# Patient Record
Sex: Female | Born: 1940 | Race: White
Health system: Southern US, Community
[De-identification: ages and names within clinical notes are randomized; demographics above are authoritative.]

## PROBLEM LIST (undated history)

## (undated) ENCOUNTER — Ambulatory Visit (INDEPENDENT_AMBULATORY_CARE_PROVIDER_SITE_OTHER): Admission: RE | Payer: Self-pay

## (undated) VITALS — BP 139/67 | HR 68

## (undated) DIAGNOSIS — F909 Attention-deficit hyperactivity disorder, unspecified type: Secondary | ICD-10-CM

## (undated) DIAGNOSIS — I10 Essential (primary) hypertension: Secondary | ICD-10-CM

## (undated) DIAGNOSIS — M1990 Unspecified osteoarthritis, unspecified site: Secondary | ICD-10-CM

## (undated) DIAGNOSIS — Z923 Personal history of irradiation: Secondary | ICD-10-CM

## (undated) DIAGNOSIS — F419 Anxiety disorder, unspecified: Secondary | ICD-10-CM

## (undated) DIAGNOSIS — M810 Age-related osteoporosis without current pathological fracture: Secondary | ICD-10-CM

## (undated) DIAGNOSIS — K219 Gastro-esophageal reflux disease without esophagitis: Secondary | ICD-10-CM

## (undated) DIAGNOSIS — C50919 Malignant neoplasm of unspecified site of unspecified female breast: Secondary | ICD-10-CM

## (undated) DIAGNOSIS — J302 Other seasonal allergic rhinitis: Secondary | ICD-10-CM

## (undated) DIAGNOSIS — M1711 Unilateral primary osteoarthritis, right knee: Secondary | ICD-10-CM

## (undated) DIAGNOSIS — M545 Low back pain, unspecified: Secondary | ICD-10-CM

## (undated) DIAGNOSIS — R112 Nausea with vomiting, unspecified: Secondary | ICD-10-CM

## (undated) DIAGNOSIS — J45909 Unspecified asthma, uncomplicated: Secondary | ICD-10-CM

## (undated) HISTORY — PX: D&C WITH HYSTEROSCOPY: SHX510231

## (undated) HISTORY — DX: Other seasonal allergic rhinitis: J30.2

## (undated) HISTORY — PX: EYE SURGERY: SHX253

## (undated) HISTORY — DX: Anxiety disorder, unspecified: F41.9

## (undated) HISTORY — DX: Unilateral primary osteoarthritis, right knee: M17.11

## (undated) HISTORY — DX: Nausea with vomiting, unspecified: R11.2

## (undated) HISTORY — PX: ABCESS DRAINAGE: SHX399

## (undated) HISTORY — PX: JOINT REPLACEMENT: SHX530

## (undated) HISTORY — PX: BREAST LUMPECTOMY: SHX2

## (undated) HISTORY — PX: ABDOMINAL HYSTERECTOMY: SHX81

## (undated) HISTORY — PX: OTHER SURGICAL HISTORY: SHX169

## (undated) HISTORY — PX: KNEE ARTHROSCOPY: SHX127

## (undated) HISTORY — PX: BREAST BIOPSY: SHX20

---

## 1990-10-12 HISTORY — PX: HYSTERECTOMY: SHX81

## 1994-08-02 ENCOUNTER — Emergency Department: Admit: 1994-08-02 | Payer: Self-pay | Admitting: Physical Medicine & Rehabilitation

## 1996-01-04 ENCOUNTER — Ambulatory Visit
Admit: 1996-01-04 | Disposition: A | Discharge status: Home / Self Care | Payer: Self-pay | Source: Ambulatory Visit | Admitting: Internal Medicine

## 1996-06-28 ENCOUNTER — Ambulatory Visit: Admission: RE | Admit: 1996-06-28 | Payer: Self-pay | Source: Ambulatory Visit | Admitting: Gastroenterology

## 1996-09-19 ENCOUNTER — Inpatient Hospital Stay
Admit: 1996-09-19 | Disposition: A | Discharge status: Home / Self Care | Payer: Self-pay | Source: Ambulatory Visit | Admitting: Internal Medicine

## 1997-10-23 ENCOUNTER — Ambulatory Visit
Admit: 1997-10-23 | Disposition: A | Discharge status: Home / Self Care | Payer: Self-pay | Source: Ambulatory Visit | Admitting: Internal Medicine

## 1997-11-07 ENCOUNTER — Inpatient Hospital Stay
Admit: 1997-11-07 | Disposition: A | Discharge status: Home / Self Care | Payer: Self-pay | Source: Ambulatory Visit | Admitting: Critical Care Medicine

## 1999-06-12 ENCOUNTER — Ambulatory Visit: Admit: 1999-06-12 | Disposition: A | Discharge status: Home / Self Care | Payer: Self-pay | Source: Ambulatory Visit

## 2000-06-22 ENCOUNTER — Ambulatory Visit: Admit: 2000-06-22 | Disposition: A | Discharge status: Home / Self Care | Payer: Self-pay | Source: Ambulatory Visit

## 2005-12-09 ENCOUNTER — Ambulatory Visit
Admit: 2005-12-09 | Disposition: A | Discharge status: Home / Self Care | Payer: Self-pay | Source: Ambulatory Visit | Admitting: Surgery

## 2005-12-09 LAB — CBC WITH AUTO DIFFERENTIAL CERNER
Basophils Absolute: 0 /mm3 (ref 0.0–0.2)
Basophils: 1 % (ref 0–2)
Eosinophils Absolute: 0.1 /mm3 (ref 0.0–0.7)
Eosinophils: 2 % (ref 0–5)
Granulocytes Absolute: 2.2 /mm3 (ref 1.8–8.1)
Hematocrit: 40.8 % (ref 37.0–47.0)
Hgb: 14.1 G/DL (ref 12.0–16.0)
Lymphocytes Absolute: 1 /mm3 (ref 0.5–4.4)
Lymphocytes: 27 % (ref 15–41)
MCH: 30.3 PG (ref 28.0–32.0)
MCHC: 34.5 G/DL (ref 32.0–36.0)
MCV: 88 FL (ref 80.0–100.0)
MPV: 9.1 FL (ref 7.4–10.4)
Monocytes Absolute: 0.3 /mm3 (ref 0.0–1.2)
Monocytes: 8 % (ref 0–11)
Neutrophils %: 62 % (ref 52–75)
Platelets: 177 /mm3 (ref 140–400)
RBC: 4.64 /mm3 (ref 4.20–5.40)
RDW: 13.7 % (ref 11.5–15.0)
WBC: 3 /mm3 — ABNORMAL LOW (ref 3.5–10.8)

## 2005-12-09 LAB — BASIC METABOLIC PANEL
BUN: 19 mg/dL (ref 8–20)
CO2: 27 mEq/L (ref 21–30)
Calcium: 9 mg/dL (ref 8.6–10.2)
Chloride: 104 mEq/L (ref 98–107)
Creatinine: 0.7 mg/dL (ref 0.6–1.5)
Glucose: 97 mg/dL (ref 70–100)
Potassium: 3.9 mEq/L (ref 3.6–5.0)
Sodium: 143 mEq/L (ref 136–146)

## 2005-12-09 LAB — GFR

## 2005-12-21 ENCOUNTER — Ambulatory Visit: Admission: RE | Admit: 2005-12-21 | Payer: Self-pay | Source: Ambulatory Visit | Admitting: Surgery

## 2006-09-15 ENCOUNTER — Ambulatory Visit: Admission: RE | Admit: 2006-09-15 | Payer: Self-pay | Source: Ambulatory Visit | Admitting: Gastroenterology

## 2010-10-12 DIAGNOSIS — C50919 Malignant neoplasm of unspecified site of unspecified female breast: Secondary | ICD-10-CM

## 2010-10-12 HISTORY — DX: Malignant neoplasm of unspecified site of unspecified female breast: C50.919

## 2010-12-05 ENCOUNTER — Ambulatory Visit
Admit: 2010-12-05 | Disposition: A | Discharge status: Home / Self Care | Payer: Self-pay | Source: Ambulatory Visit | Admitting: Surgery

## 2010-12-16 ENCOUNTER — Ambulatory Visit
Admit: 2010-12-16 | Disposition: A | Discharge status: Home / Self Care | Payer: Self-pay | Source: Ambulatory Visit | Admitting: Radiation Oncology

## 2011-01-09 ENCOUNTER — Ambulatory Visit
Admit: 2011-01-09 | Disposition: A | Discharge status: Home / Self Care | Payer: Self-pay | Source: Ambulatory Visit | Admitting: Surgery

## 2011-01-09 LAB — BASIC METABOLIC PANEL
BUN: 26 mg/dL — ABNORMAL HIGH (ref 8–20)
CO2: 25 mEq/L (ref 21–30)
Calcium: 9.4 mg/dL (ref 8.6–10.2)
Chloride: 106 mEq/L (ref 98–107)
Creatinine: 0.7 mg/dL (ref 0.6–1.5)
Glucose: 57 mg/dL — ABNORMAL LOW (ref 70–100)
Potassium: 4.7 mEq/L (ref 3.6–5.0)
Sodium: 144 mEq/L (ref 136–146)

## 2011-01-09 LAB — CBC AND DIFFERENTIAL
Basophils Absolute Automated: 0.03 10*3/uL (ref 0.00–0.20)
Basophils Automated: 0 % (ref 0–2)
Eosinophils Absolute Automated: 0.08 10*3/uL (ref 0.00–0.70)
Eosinophils Automated: 1 % (ref 0–5)
Hematocrit: 47.9 % — ABNORMAL HIGH (ref 37.0–47.0)
Hgb: 15.7 g/dL (ref 12.0–16.0)
Immature Granulocytes Absolute: 0.01 10*3/uL
Immature Granulocytes: 0 % (ref 0–1)
Lymphocytes Absolute Automated: 1.27 10*3/uL (ref 0.50–4.40)
Lymphocytes Automated: 22 % (ref 15–41)
MCH: 29.6 pg (ref 28.0–32.0)
MCHC: 32.8 g/dL (ref 32.0–36.0)
MCV: 90.4 fL (ref 80.0–100.0)
MPV: 12.5 fL — ABNORMAL HIGH (ref 9.4–12.3)
Monocytes Absolute Automated: 0.51 10*3/uL (ref 0.00–1.20)
Monocytes: 9 % (ref 0–11)
Neutrophils Absolute: 3.91 10*3/uL (ref 1.80–8.10)
Neutrophils: 67 % (ref 52–75)
Nucleated RBC: 0 /100 WBC
Platelets: 177 10*3/uL (ref 140–400)
RBC: 5.3 10*6/uL (ref 4.20–5.40)
RDW: 14 % (ref 12–15)
WBC: 5.81 10*3/uL (ref 3.50–10.80)

## 2011-01-09 LAB — GFR: EGFR: >60

## 2011-01-11 HISTORY — PX: BREAST SURGERY: SHX581

## 2011-01-21 ENCOUNTER — Ambulatory Visit: Admission: RE | Admit: 2011-01-21 | Payer: Self-pay | Source: Ambulatory Visit | Attending: Surgery | Admitting: Surgery

## 2011-01-21 ENCOUNTER — Ambulatory Visit: Payer: Self-pay

## 2011-01-23 LAB — LAB USE ONLY - HISTORICAL SURGICAL PATHOLOGY

## 2011-02-16 ENCOUNTER — Ambulatory Visit: Admit: 2011-02-16 | Discharge: 2011-02-16 | Payer: Self-pay | Source: Ambulatory Visit

## 2011-03-13 ENCOUNTER — Ambulatory Visit: Admit: 2011-03-13 | Discharge: 2011-03-13 | Payer: Self-pay | Source: Ambulatory Visit

## 2011-06-11 ENCOUNTER — Ambulatory Visit: Admit: 2011-06-11 | Discharge: 2011-06-11 | Payer: Self-pay | Source: Ambulatory Visit

## 2011-06-13 ENCOUNTER — Ambulatory Visit: Admit: 2011-06-13 | Discharge: 2011-06-13 | Payer: Self-pay | Source: Ambulatory Visit

## 2011-06-24 LAB — ECG 12-LEAD
Atrial Rate: 73 {beats}/min
P Axis: 14 degrees
P-R Interval: 140 ms
Q-T Interval: 410 ms
QRS Duration: 86 ms
QTC Calculation (Bezet): 451 ms
R Axis: 69 degrees
T Axis: 52 degrees
Ventricular Rate: 73 {beats}/min

## 2011-06-29 ENCOUNTER — Emergency Department
Admit: 2011-06-29 | Disposition: A | Discharge status: Home / Self Care | Payer: Self-pay | Source: Emergency Department | Admitting: Internal Medicine

## 2011-07-25 LAB — ECG 12-LEAD
Atrial Rate: 78 {beats}/min
P Axis: 66 degrees
P-R Interval: 152 ms
Q-T Interval: 386 ms
QRS Duration: 92 ms
QTC Calculation (Bezet): 440 ms
R Axis: 69 degrees
T Axis: 62 degrees
Ventricular Rate: 78 {beats}/min

## 2011-07-30 NOTE — Op Note (Signed)
DATE OF BIRTH:                        11-06-1940      ADMISSION DATE:                     12/21/2005            PATIENT LOCATION:                     [id number]            DATE OF PROCEDURE:                   12/21/2005      SURGEON:                            Arnette Norris, MD      ASSISTANT(S):                  PREOPERATIVE DIAGNOSIS:  PREVIOUS STEREOTACTIC BIOPSY WITH ATYPICAL      INTRADUCTAL HYPERPLASIA.            POSTOPERATIVE DIAGNOSIS:  PREVIOUS STEREOTACTIC BIOPSY WITH ATYPICAL      INTRADUCTAL HYPERPLASIA.            PROCEDURE:  NEEDLE PLACEMENT, LEFT BREAST BIOPSY.            ASSISTANT:  Josie Hymes, PA student            COMPLICATIONS:  None.            ESTIMATED BLOOD LOSS:   Minimal.            ANESTHESIA:  Local sedation.            INDICATION:  The patient is a 70 year old woman, kindly referred by her      internist, Dr. Gwenyth Allegra and her gynecologist, Dr. Baird Cancer      for surgical evaluation.  This patient had had a stereotactic biopsy for      microcalcifications which showed areas of atypical intraductal hyperplasia.      I discussed this with the patient, explaining that the standard is to      perform an excisional biopsy to exclude further advanced disease.            The patient understood this, as well as the potential risk versus benefits      of operation and elected to proceed with surgical excision on this date.            On the day of surgery, the patient was first taken to x-ray, where a hook      needle and methylene blue dye were injected into the left breast to      demarcate the area to be excised.  Once this was in place, the patient was      brought to the operating room.            The patient and her husband were seen in the preoperative holding area.      Final questions were answered.  The boarding pass and informed consent were      complete, and she was taken to the operating room.            DESCRIPTION OF PROCEDURE:  The patient was placed on the  operative table in  the supine position.  After she was sedated, the left breast was prepped      and draped in the usual sterile fashion.  The skin and subcutaneous tissue      in the site to be incised was infiltrated with a combination of local      anesthetic.  When this had taken effect, a curvilinear incision within the      skin folds of the breast was made.  The subcutaneous tissue was divided.      The needle shaft identified.  The shaft was then followed toward the breast      parenchyma.  At the appropriate level, the parenchyma was grasped with an      Allis clamp and excisional biopsy around the needle was done.  It contained      the methylene blue dye and contained the tip of the needle, as well.  The      specimen was then sent for confirmation radiographically.  When this was      complete, the biopsy site was inspected.  Fine bleeding controlled with      cautery and, when satisfied, further local was injected for postoperative      analgesia.            The skin was then closed with combination of interrupted, inverting 4-0      Vicryl subcutaneous stitches and a running, subcuticular Monocryl.  The      wounds were cleansed and dressed sterilely.  The patient tolerated the      procedure well, had no intraoperative complications and was transferred to      the recovery area in stable condition, with all sponge, needle and      instrument counts reported as correct by the nursing staff at the      conclusion of the case.                                                Electronic Signing MD: Arnette Norris, MD  ([id number])            D: 12/21/2005 by Arnette Norris, MD      T: 12/21/2005 by [id number] ([id number]) ([id number])      cc:  Gwenyth Allegra, MD          Janann August, MD          Arnette Norris, MD

## 2011-08-12 NOTE — Op Note (Signed)
[name], [name]      MRN:          [id number]      Account:      [id number]      Document ID:  [id number]      Procedure Date: 01/21/2011            Admit Date: 01/21/2011            Patient Location: DISCHARGED 01/21/2011      Patient Type: A            SURGEON:      ASSISTANT:                  ASSISTANT:      Dr. Leticia Clas.            PREOPERATIVE DIAGNOSIS:      Ductal carcinoma of the left breast.            POSTOPERATIVE DIAGNOSIS:      Ductal carcinoma of the left breast.            TITLE OF PROCEDURE:      1.  Needle placement, lumpectomy left breast upper outer quadrant.      2.  Left axillary sentinel lymph node biopsy with frozen section pathology.      3.  Use of the gamma probe to detect the sentinel node.      4.  Injection of Lymphazurin blue dye for sentinel lymph node detection.            COMPLICATIONS:      None.            ESTIMATED BLOOD LOSS:      Minimal.            ANESTHESIA:      General.            INDICATIONS:      The patient is a 70 year old woman kindly referred by her physicians      including Dr. Baird Cancer from gynecology and Dr. Gwenyth Allegra from      medicine for surgical evaluation of a well-differentiated invasive ductal      carcinoma proven by needle biopsy.  The patient was seen in consultation,      the findings reviewed.  The rationale for surgery in a breast conserving      matter was discussed in detail with she and her husband.  Additionally, the      patient also had consultation with Dr. Felicity Coyer from medical      oncology who concurred with this approach following bilateral breast MRI.            The patient understood the procedure to be undertaken, accepted risks and      benefits and elected operative intervention on this date.                                   Page 1 of 3      [name], [name]      MRN:          [id number]      Account:      [id number]      Document ID:  [id number]      Procedure Date: 01/21/2011                  On the day of  surgery, after the patient was seen in radiology and nuclear      medicine, I met with she and her husband preoperatively.  Final questions      were answered.  The boarding pass and informed consent were complete.  She      was covered prophylactically with vancomycin secondary to her PENICILLIN      allergy.  Pneumatic stockings were placed knee-high and she was taken to      the operating room.            DESCRIPTION OF PROCEDURE:      The patient was placed on the operative table in the supine position and      after she was placed under general LMA anesthesia, the left axilla was      probed with the Neoprobe and the area of highest intensity was marked.      Then, after the pause had been accomplished, Lymphazurin blue dye was      injected, 2 mL, in the periareolar region.  The breast was appropriately      massaged and when complete was then prepped and draped in the usual sterile      fashion.  Using the lines that had been drawn for incision obliquely in the      upper outer quadrant, the skin and subcutaneous tissue was injected with a      combination of local anesthetic.  Once this had taken effect, the incision      was opened and skin flaps elevated circumferentially around the area for      lumpectomy.            However, initially attention was focused then to the upper portion of the      incision where the incision was carried down to the clavipectoral fascia      which was opened and the axilla entered.  Then following the methylene blue      dye, a lymph node of blue intensity was noted and this also was the highest      intensity.  When probed with the Neoprobe.  It was excised and sent to      pathology as the sentinel node.  After this was removed, the axilla was      probed once again and there was one additional area of counts greater than      10% of baseline and there was another slightly less intense blue node which      was removed and was sent as additional lymphatic tissue.  Dr.  Dorothyann Gibbs      reported that the lymph node was negative for metastasis.  Satisfied then,      attention was focused back to the area for lumpectomy.            A full-thickness lumpectomy was performed with grossly wide margins.  It      should be noted that the lateral margin was the lateral extent of the      breast and the superior margin was the tail of the breast and the deep      margin was the pectoral fascia.  Once the specimen was removed,  however,      was placed on the back table would be inked by the attending surgeon prior      to sending it to radiology.            The wound was then checked.  Fine  bleeding controlled with cauterization.      Further local injected for postoperative analgesia prior to skin closure,      which was done in the usual way.            The patient tolerated the procedure well, had no intraoperative                                   Page 2 of 3      [name], [name]      MRN:          [id number]      Account:      [id number]      Document ID:  [id number]      Procedure Date: 01/21/2011            complications and was transferred to the outpatient recovery area in stable      condition with all sponge, needle and instrument count reported as correct      by the nursing staff at the conclusion of the case.                        Electronic Signing Provider            D:  01/21/2011 11:45 AM by Dr. Arnette Norris, MD [phone])      T:  01/21/2011 20:32 PM by [id number]                  HK:[id number] Dareen Piano MD      Baird Cancer MD      Felicity Coyer MD                                   Page 3 of 3      Authenticated by Arnette Norris, MD [phone]) On 02/04/2011 05:52:01 PM

## 2011-09-29 NOTE — Op Note (Signed)
MRN: [id number] DOCUMENT ID: [id number]      INTRODUCTION:      70 YEAR OLD FEMALE PATIENT PRESENTS FOR AN ELECTIVE OUTPATIENT      COLONOSCOPY.  THE INDICATIONS FOR THE PROCEDURE WERE A POSITIVE FAMILY      HISTORY OF COLON CANCER AND DIARRHEA.      CONSENT:      THE BENEFITS, RISKS, AND ALTERNATIVES TO THE PROCEDURE WERE DISCUSSED AND      INFORMED CONSENT WAS OBTAINED.      PREPARATION:      EKG, PULSE, PULSE OXIMETRY AND BLOOD PRESSURE MONITORED.      MEDICATIONS:      - MIDAZOLAM HCL 5 MG IV      - FENTANYL 200 MCG IV      - ZOFRAN 4 MG IV BEFORE THE PROCEDURE      THE ENDOSCOPE WAS PASSED WITH A MODERATE AMOUNT OF DIFFICULTY THROUGH A      VERY TORTUOUS AND FLOPPY COLON UNDER DIRECT VISUALIZATION TO THE TERMINAL      ILEUM CONFIRMED BY LANDMARKS.  THE STUDY WAS PERFORMED WITH A PCF-160AL.      THE QUALITY OF THE PREPARATION WAS GOOD.  SOME WASHING WAS REQUIRED TO      REMOVE THICK GREEN FLUID AND SEE ALL MUCOSAL SURFACES WELL.  RETROFLEXION      WAS PERFORMED IN THE RECTUM.  SCOPE WITHDRAWEL TIME FROM THE CECUM WAS:11      MINUTES.      FINDINGS:  NO POLYPS WERE SEEN.  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  NORMAL VASCULAR PATTERN THROUGHOUT, BUT RANDOM BIOPSIES      WERE TAKEN IN THE CECUM TO RULE OUT MICROSCOPIC INFLAMMATION.  SMALL      INTERNAL HEMORRHOIDS WERE PRESENT.  THE COLONOSCOPY WAS OTHERWISE NORMAL.      COMPLICATIONS:      THERE WERE NO COMPLICATIONS ASSOCIATED WITH THE PROCEDURE.      IMPRESSION:      1.  NO POLYPS WERE SEEN.      2.  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      3.  NORMAL VASCULAR PATTERN THROUGHOUT, BUT RANDOM BIOPSIES WERE TAKEN IN      THE CECUM TO RULE OUT MICROSCOPIC INFLAMMATION.      4.  SMALL INTERNAL HEMORRHOIDS WERE PRESENT [455.0].      5.  COLONOSCOPY, OTHERWISE NORMAL.      RECOMMENDATION:      - REPEAT COLONOSCOPY IN 5 YEARS.      - FOLLOW-UP ON THE RESULTS OF BIOPSY SPECIMENS IN 10 DAYS.      SIGNING PHYSICIAN: Zakiah Beckerman S

## 2011-11-13 ENCOUNTER — Emergency Department: Payer: Medicare Other

## 2011-11-13 ENCOUNTER — Emergency Department
Admission: EM | Admit: 2011-11-13 | Discharge: 2011-11-13 | Disposition: A | Discharge status: Home / Self Care | Payer: Medicare Other | Attending: Emergency Medicine | Admitting: Emergency Medicine

## 2011-11-13 DIAGNOSIS — I10 Essential (primary) hypertension: Secondary | ICD-10-CM | POA: Insufficient documentation

## 2011-11-13 DIAGNOSIS — S300XXA Contusion of lower back and pelvis, initial encounter: Secondary | ICD-10-CM | POA: Insufficient documentation

## 2011-11-13 DIAGNOSIS — W010XXA Fall on same level from slipping, tripping and stumbling without subsequent striking against object, initial encounter: Secondary | ICD-10-CM | POA: Insufficient documentation

## 2011-11-13 DIAGNOSIS — T148XXA Other injury of unspecified body region, initial encounter: Secondary | ICD-10-CM

## 2011-11-13 NOTE — ED Provider Notes (Signed)
History     Chief Complaint   Patient presents with   . Tailbone Pain     HPI Comments: 71 y.o. female s/p slip on ice just PTA.  "my legs went forward and I landed on my bottom".  Immediate pain to tail bone.  Stood up with assistance after fall.  No back pain. Did not hit head or have LOC.  No preceding cp/sob/palpitations.      Patient is a 71 y.o. female presenting with fall.   Fall  The accident occurred 1 to 2 hours ago. The fall occurred while walking. She landed on concrete. The pain is at a severity of 2/10. The pain is mild. She was ambulatory at the scene. There was no drug use involved in the accident. Pertinent negatives include no visual change, no numbness, no nausea, no headaches and no loss of consciousness.       Past Medical History   Diagnosis Date   . Cancer    . Hypertensive disorder        Past Surgical History   Procedure Date   . Breast surgery        History reviewed. No pertinent family history.    No current facility-administered medications for this encounter.     No current outpatient prescriptions on file.       Allergies   Allergen Reactions   . Erythromycin    . Morphine    . Penicillins    . Sulfur    . Tetracycline        History   Substance Use Topics   . Smoking status: Never Smoker    . Smokeless tobacco: Not on file   . Alcohol Use: No       Review of Systems   Gastrointestinal: Negative for nausea.   Neurological: Negative for loss of consciousness, numbness and headaches.   [all other systems reviewed and are negative        Physical Exam   BP 186/81  Pulse 75  Temp(Src) 97.8 F (36.6 C) (Oral)  Resp 18    Physical Exam  Constitutional: Vital signs reviewed.  Oriented to person, place and time. Comfortable.  Head: Normocephalic  Eyes: No conjunctival injection. No discharge.  ENT: Posterior pharynx normal. No exudates.  No trismus. No drooling.  No tongue elevation.    Neck: Normal range of motion. Non-tender.  Respiratory/Chest: Clear to auscultation. No respiratory  distress. No tenderness. No chest wall or neck crepitus.   Cardiovascular: Normal rate. Regular rhythm.  Capillary refill < 1 second in all extremities.  No JVD.  Abdomen: Soft and non-tender. No guarding. No rebound. No distension.    Genitourinary:   UpperExtremity: No edema.     Back:  No CVA tenderness.  No spine tenderness.  TTP over coccyx.   Nl ROM over bilateral hips. No hip tenderness.  No ttp over pelvis.  LowerExtremity: No edema.  No calf tenderness.  Neurological: Normal and symmetric strength and sensation in bilateral arms and legs  Skin: Warm and dry. No rash.  Lymphatic: No cervical lymphadenopathy.  Psychiatric: Normal affect. Normal concentration.    ED Course   Procedures    MDM  Number of Diagnoses or Management Options  Diagnosis management comments: 71 y.o. female s/p fall as described above.  Now w/ tenderness over coccyx.  Xrays of her coccyx and pelvis are negative for any fracture.  Pt ambulating now without any difficulty and seems quite comfortable.  Will d/c.  Amount and/or Complexity of Data Reviewed  Tests in the radiology section of CPT: ordered and reviewed    Risk of Complications, Morbidity, and/or Mortality  Presenting problems: moderate  Diagnostic procedures: moderate  Management options: moderate    Patient Progress  Patient progress: stable      Initial MD eval by Dr Delorse Lek at 8:23 PM           Delorse Lek, MD  11/13/11 2051

## 2011-11-13 NOTE — ED Notes (Signed)
Patient states she fell [address]. [address] backwards injuring lower back. Denies LOC

## 2011-11-13 NOTE — Discharge Instructions (Signed)
Contusion     You have been diagnosed with a contusion.     A contusion is a bruise. A contusion occurs when something strikes or hits the body. This breaks small blood vessels called capillaries. When the capillaries break, blood leaks out. This makes the skin look red, purple, blue, or black. The injured area may hurt for a few days. If you take a blood thinner (like Coumadin or warfarin) the bruising may be worse.     Apply ice to the bruise. Avoid using the injured body part.     Apply ice to help with pain and swelling. Put some ice cubes in a re-sealable plastic bag (like Ziploc). Add some water. Seal the bag. Put a thin washcloth between the bag and the skin. Apply the ice bag for at least 20 minutes. Do this at least 4 times per day. It’s okay to apply ice longer or more often. NEVER APPLY ICE DIRECTLY TO THE SKIN. Always keep a washcloth between the ice pack and your body.     YOU SHOULD SEEK MEDICAL ATTENTION IMMEDIATELY, EITHER HERE OR AT THE NEAREST EMERGENCY DEPARTMENT, IF ANY OF THE FOLLOWING OCCURS:  · Your pain or swelling gets much worse.  · You develop new numbness or tingling in or below the affected area.  · Your foot or hand looks cold or pale. This could mean there is a problem with circulation (blood supply).

## 2011-11-30 ENCOUNTER — Other Ambulatory Visit: Payer: Self-pay | Admitting: Orthopaedic Surgery

## 2011-11-30 DIAGNOSIS — M25561 Pain in right knee: Secondary | ICD-10-CM

## 2011-11-30 DIAGNOSIS — M25461 Effusion, right knee: Secondary | ICD-10-CM

## 2011-12-01 ENCOUNTER — Ambulatory Visit: Payer: Medicare Other | Attending: Orthopaedic Surgery

## 2011-12-01 DIAGNOSIS — M2240 Chondromalacia patellae, unspecified knee: Secondary | ICD-10-CM | POA: Insufficient documentation

## 2011-12-01 DIAGNOSIS — Z853 Personal history of malignant neoplasm of breast: Secondary | ICD-10-CM | POA: Insufficient documentation

## 2011-12-01 DIAGNOSIS — M25461 Effusion, right knee: Secondary | ICD-10-CM

## 2011-12-01 DIAGNOSIS — M25561 Pain in right knee: Secondary | ICD-10-CM

## 2011-12-01 DIAGNOSIS — S83106A Unspecified dislocation of unspecified knee, initial encounter: Secondary | ICD-10-CM | POA: Insufficient documentation

## 2011-12-01 DIAGNOSIS — M25569 Pain in unspecified knee: Secondary | ICD-10-CM | POA: Insufficient documentation

## 2011-12-07 ENCOUNTER — Ambulatory Visit
Admission: RE | Admit: 2011-12-07 | Disposition: A | Discharge status: Home / Self Care | Payer: Self-pay | Source: Ambulatory Visit | Attending: Gastroenterology | Admitting: Gastroenterology

## 2012-06-12 ENCOUNTER — Ambulatory Visit: Payer: Medicare Other | Attending: Hematology & Oncology

## 2012-06-12 DIAGNOSIS — C50919 Malignant neoplasm of unspecified site of unspecified female breast: Secondary | ICD-10-CM | POA: Insufficient documentation

## 2012-06-16 ENCOUNTER — Encounter: Payer: Self-pay | Admitting: Radiation Oncology

## 2012-06-16 ENCOUNTER — Ambulatory Visit: Payer: Self-pay | Admitting: Radiation Oncology

## 2012-06-16 DIAGNOSIS — C50919 Malignant neoplasm of unspecified site of unspecified female breast: Secondary | ICD-10-CM

## 2012-06-20 DIAGNOSIS — C50919 Malignant neoplasm of unspecified site of unspecified female breast: Secondary | ICD-10-CM

## 2012-06-20 HISTORY — DX: Malignant neoplasm of unspecified site of unspecified female breast: C50.919

## 2012-06-21 NOTE — Progress Notes (Signed)
[name] came in today with her husband for a followup visit.  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  She is doing well overall.  She had a  mammogram in April of this year which was negative.  She continues on  anastrozole and she is tolerating that well.  She continues to follow with  Dr. Derek Mound and Dr. Molly Maduro.     PHYSICAL EXAMINATION:  GENERAL:  She appears well.    LYMPHATICS:  She has no palpable supraclavicular or axillary  lymphadenopathy bilaterally.    BREASTS:  The breasts are without masses bilaterally.  She has no  hyperpigmentation.     IMPRESSION:  Doing well with no evidence of recurrence.     RECOMMENDATION:  I discussed with the patient that she should continue to have yearly  mammograms.  She will continue to follow with Dr. Molly Maduro and Dr. Derek Mound.   She had some questions about the anastrozole.  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  I told her that she could  discuss this further with Dr. Molly Maduro.  We did discuss the role of hormonal  therapy in reducing her risk of local recurrence and reducing her risk of a  new contralateral breast cancer in the future.  All of her questions were  answered.  She will see me on an as-needed basis.

## 2013-06-24 ENCOUNTER — Ambulatory Visit (INDEPENDENT_AMBULATORY_CARE_PROVIDER_SITE_OTHER): Payer: Medicare Other | Admitting: Adult Health

## 2013-06-24 ENCOUNTER — Encounter (INDEPENDENT_AMBULATORY_CARE_PROVIDER_SITE_OTHER): Payer: Self-pay

## 2013-06-24 VITALS — BP 146/71 | HR 71 | Temp 98.0°F | Resp 14 | Ht 65.0 in | Wt 130.0 lb

## 2013-06-24 DIAGNOSIS — S0512XA Contusion of eyeball and orbital tissues, left eye, initial encounter: Secondary | ICD-10-CM

## 2013-06-24 DIAGNOSIS — S0510XA Contusion of eyeball and orbital tissues, unspecified eye, initial encounter: Secondary | ICD-10-CM

## 2013-06-24 DIAGNOSIS — S0993XA Unspecified injury of face, initial encounter: Secondary | ICD-10-CM

## 2013-06-24 MED ORDER — MOXIFLOXACIN HCL 0.5 % OP SOLN
1.0000 [drp] | Freq: Three times a day (TID) | OPHTHALMIC | Status: AC
Start: 2013-06-24 — End: 2013-07-01

## 2013-06-24 MED ORDER — CEPHALEXIN 500 MG PO CAPS
500.0000 mg | ORAL_CAPSULE | Freq: Four times a day (QID) | ORAL | Status: AC
Start: 2013-06-24 — End: 2013-07-04

## 2013-06-24 NOTE — Progress Notes (Signed)
Subjective:       Patient ID: [name] is a 72 y.o. female.  Chief Complaint   Patient presents with   . Fall     c/o fall on concrete curb x 12 hours. Pt use alert and orientated with bruising throughout left side of face. Pt denies dizziness and vision change. Pt has not self treated with OTC.         Fall  The accident occurred 12 to 24 hours ago. The fall occurred while walking. She fell from a height of 3 to 5 ft. She landed on concrete. The volume of blood lost was minimal. The point of impact was the face, right knee and left hip. The pain is present in the face and head. The pain is at a severity of 4/10. The pain is moderate. Pertinent negatives include no abdominal pain, bowel incontinence, loss of consciousness, numbness, tingling, visual change or vomiting. She has tried ice, immobilization and acetaminophen for the symptoms. The treatment provided no relief.       The following portions of the patient's history were reviewed and updated as appropriate: allergies, current medications, past family history, past medical history, past social history, past surgical history and problem list.    Review of Systems   Constitutional: Positive for activity change.   HENT: Positive for facial swelling.    Eyes: Positive for redness.   Gastrointestinal: Negative for vomiting, abdominal pain and bowel incontinence.   Neurological: Negative for tingling, loss of consciousness and numbness.   All other systems reviewed and are negative.            Objective:     Physical Exam   Constitutional: Vital signs are normal. She appears well-developed and well-nourished. She is active.   HENT:   Head: Normocephalic.       Right Ear: Hearing, tympanic membrane, external ear and ear canal normal.   Left Ear: Hearing, tympanic membrane, external ear and ear canal normal.   Nose: Mucosal edema and rhinorrhea present.   Mouth/Throat: Uvula is midline. Posterior oropharyngeal edema and posterior oropharyngeal erythema  present.       Eyes: Pupils are equal, round, and reactive to light. Left eye exhibits exudate. Left eye exhibits no discharge and no hordeolum. No foreign body present in the left eye.       Neurological: She is alert.   Skin: Skin is warm and dry.           Assessment:       1. Facial injury, initial encounter  cephALEXin (KEFLEX) 500 MG capsule    moxifloxacin (VIGAMOX) 0.5 % ophthalmic solution   2. Eye contusion, left, initial encounter  moxifloxacin (VIGAMOX) 0.5 % ophthalmic solution           Plan:        Medicines as prescribed .    Follow-up w/ PMD

## 2013-06-24 NOTE — Patient Instructions (Signed)
Laceration, Lip and Mouth  Alaceration is a cut through the skin. When the cut is on the outside of the lip, it may be closed with stitches, surgical tape, or sometimes skin glue. Cuts inside the mouth may be sutured or left open, depending on the size. When stitches are used in the mouth, they are usually the kind that dissolve.    Home care  The following guidelines will help you care for your laceration at home:   Eat soft foods to reduce pain when chewing.   If the cut isinsideyour mouth, clean the wound by rinsing your mouth after each meal and at bedtime with a mixture of equal parts water and hydrogen peroxide (do not swallow!). Or, you can use a cotton swab to apply hydrogen peroxide directly onto the cut.   Mouth wounds can be painful when eating. You may use a local, over-the-counter numbing solution for pain relief. If this is not available, you may use any numbing solution for teething babies. You may apply this directly to the sores with a cotton-tip swab or with your finger.   If the cut is on theoutsideof the lip and sutures were used, you may shower as usual after the first 24 hours, but do not put your head under water until the sutures are removed. After removing the bandage, wash the area with soap and water. Use a wet cotton swab to loosen and remove any blood or crust that forms. After cleaning, keep the wound clean and dry. Talk with your doctor before applying any antibiotic ointment to the wound. You may apply an adhesive bandage or leave the wound open.   If surgical tape was used, keep the area clean and dry. If it becomes wet, blot it dry with a towel. Talk with your doctor before applying any antibiotic ointment to the wound. The surgical tape closures will usually fall off after about 5 days.   If skin glue was used, do not scratch, rub, or pick at the adhesive film. Do not place tape directly over the film.Do not apply liquid, ointment, or creams to the wound while the  film is inplace.Do not clean the wound with peroxide and do not apply ointment. Avoid activities that cause heavy sweating until the film has fallen off. Protect the wound from prolonged exposure to sunlight or tanning lamps. You may shower as usual but do not soak the wound in water (no swimming).   If you were given an antibiotic to prevent infection, do not stop taking this medication until you have finished the prescribed course or the doctor tells you to stop.   The doctor may prescribe medications for pain. Follow the doctor's instructions for taking these medications.If you have chronic liver or kidney disease or ever had a stomach ulcer or GI bleeding, talk with your doctor before using these medicines.  Follow-up care  Follow up with your health care provider. Cuts in and around the mouth heal in about five days. However, even with proper treatment, a wound infection sometimes occurs. Therefore, check the wound daily for the warning signs listed below. Stitches should not be left in the face for more thanfivedays; otherwise, permanent stitch marks may form. Unless told otherwise, you may remove surgical tape closures yourself afterfive days, if they have not already fallen off. Ifskin glue was used, the film will fall off by itself in 5-10 days.  When to seek medical care  Get prompt medical attention if any of these occur:     Increasing pain in the wound   Fever of 100.26F (38C) or higher, or as directed by your health care provider   Redness, swelling, or pus coming from the wound   If sutures come apart or fall out or if surgical tape falls off before three days   If the wound edges reopen   Bleeding not controlled by direct pressure   [address], [address] [place], [place] [address]. All rights reserved. This information is not intended as a substitute for professional medical care. Always follow your healthcare professional's instructions.      Facial Contusion (No  Wake-Up)  A facial contusion is a bruise with swelling and sometimes bleeding under the skin. The swelling should start to go down within two days. Although there may be no signs of a serious injury at this time, symptoms may appear later which could be a sign of a more serious problem. Therefore, watch for the warning signs below.  Home care  The following guidelines will help you care for your injury at home:   If you have swelling of the face, apply an ice pack (ice cubes in a plastic bag, wrapped in a towel) for 20 minutes every 1-2 hours until the swelling starts to go down.   If you have scrapes or cuts on your face, clean them daily with soap and water. Apply an antibiotic ointment or cream for the first few days to prevent infection.   You may use acetaminophen or ibuprofen to control pain, unless another pain medicine was prescribed.If you have chronic liver or kidney disease or ever had a stomach ulcer or GI bleeding, talk with your doctor before using these medicines. Do not use ibuprofen in children under six months of age.   For the next 24 hours:   Do not take alcohol, sedatives or medicines that make you sleepy.   Do not drive or operate machinery.   Avoid strenuous activities. No lifting or straining.   If you have had any symptoms of aconcussiontoday (nausea, vomiting, dizziness, confusion, headache, memory loss or if you were knocked out), do not return to sports or any activity that could result in another head injury until all symptoms are gone and you have been cleared by your doctor. A second head injury before fully recovering from the first one can lead to serious brain injury.  Follow-up care  Follow up with your doctor in one week or as directed.  Note: Any X-rays or CT scans taken will be reviewed by a radiologist. You will be notified of any new findings that may affect your care.  When to seek medical care  Get prompt medical attention if any of the following  occur:   Repeated vomiting   Severe or worsening headache or dizziness   Unusual drowsiness, or unable to awaken as usual   Confusion or change in behavior or speech, memory loss, blurred vision   Convulsion (seizure)   Increasing scalp or face swelling   Redness, warmth or pus from the swollen area   Fluid drainage or bleeding from the nose or ears   Fever of 100.26F (38C) or higher, or as directed by your health care provider   Increasing jaw pain with chewing or increasing pain in the sinuses   Nose looks crooked or cannot breathe through your nose after swelling goes down   [address], [address] [place], [place] [address]. All rights reserved. This information is not intended as a  substitute for professional medical care. Always follow your healthcare professional's instructions.

## 2013-11-15 ENCOUNTER — Encounter (INDEPENDENT_AMBULATORY_CARE_PROVIDER_SITE_OTHER): Payer: Self-pay | Admitting: Surgery

## 2014-01-24 ENCOUNTER — Encounter (INDEPENDENT_AMBULATORY_CARE_PROVIDER_SITE_OTHER): Payer: Self-pay

## 2014-02-16 ENCOUNTER — Ambulatory Visit (INDEPENDENT_AMBULATORY_CARE_PROVIDER_SITE_OTHER): Payer: Medicare Other | Admitting: Surgery

## 2014-05-01 ENCOUNTER — Encounter (INDEPENDENT_AMBULATORY_CARE_PROVIDER_SITE_OTHER): Payer: Self-pay | Admitting: Surgery

## 2014-05-01 ENCOUNTER — Ambulatory Visit (INDEPENDENT_AMBULATORY_CARE_PROVIDER_SITE_OTHER): Payer: Medicare Other | Admitting: Surgery

## 2014-05-01 VITALS — BP 132/70 | HR 59 | Temp 98.1°F | Ht 65.0 in | Wt 128.8 lb

## 2014-05-01 DIAGNOSIS — Z853 Personal history of malignant neoplasm of breast: Secondary | ICD-10-CM

## 2014-05-01 HISTORY — DX: Personal history of malignant neoplasm of breast: Z85.3

## 2014-05-01 NOTE — Progress Notes (Signed)
Subjective:   [name] is a 73 y.o. female who is here for her annual surgical breast follow up exam and review of her mammogram.  She had breast conserving treatment for a Grade 1, ER+,  T1N0MX left breast cancer in 2012 and remains asymptomatic.     Past Medical History   Diagnosis Date   . Cancer    . Hypertensive disorder    . Seasonal allergies    . Breast cancer      Past Surgical History   Procedure Laterality Date   . Breast surgery Left 01/2011     well diff CA 0.8 cm Grade 1 (T1) N0MX  ER +     Family History   Problem Relation Age of Onset   . Colon cancer Mother 37     Deceased   . Cancer Father    . Cancer Other 55     prostate     History     Social History   . Marital Status: Married     Spouse Name: N/A     Number of Children: N/A   . Years of Education: N/A     Occupational History   . Not on file.     Social History Main Topics   . Smoking status: Never Smoker    . Smokeless tobacco: Not on file   . Alcohol Use: No   . Drug Use: No   . Sexual Activity: Not on file     Other Topics Concern   . Not on file     Social History Narrative     Erythromycin; Morphine; Penicillins; Tetracycline; Levofloxacin; Naprosyn; and Sulfa antibiotics  Current Outpatient Prescriptions   Medication Sig Dispense Refill   . anastrozole (ARIMIDEX) 1 MG tablet Take 1 mg by mouth daily.     Marland Kitchen aspirin 81 MG tablet Take 81 mg by mouth daily.       . bisoprolol (ZEBETA) 10 MG tablet Take 5 mg by mouth daily.      . Calcium Carbonate-Vitamin D (CALTRATE 600+D PO) Take by mouth.     Marland Kitchen GLUCOSAMINE-CHONDROITIN PO Take by mouth.     Marland Kitchen ibuprofen (ADVIL,MOTRIN) 200 MG tablet Take 200 mg by mouth every 6 (six) hours as needed.       . loratadine (CLARITIN) 10 MG tablet Take 10 mg by mouth daily.       . ranitidine (ZANTAC) 150 MG tablet Take 150 mg by mouth 2 (two) times daily.     . risedronate (ACTONEL) 35 MG tablet Take 35 mg by mouth every 7 days. with water on empty stomach, nothing by mouth or lie down for next 30  minutes.       No current facility-administered medications for this visit.       Review of Systems   All other systems reviewed and are negative.    Objective:     Filed Vitals:    05/01/14 1236   BP: 132/70   Pulse: 59   Temp: 98.1 F (36.7 C)     Bilateral diagnostic mammogram w/CAD and tomosynthesis:  BIRADS 2    Physical Exam   Constitutional: She is oriented to person, place, and time. She appears well-developed and well-nourished.   HENT:   Head: Normocephalic and atraumatic.   Eyes: Pupils are equal, round, and reactive to light.   Neck: Normal range of motion. Neck supple.   Cardiovascular: Normal rate and regular rhythm.    Pulmonary/Chest: Effort  normal.   Breast exam:    Right;  FSD w/o evidence of new masses, skin change, nipple retraction or discharge, or LN  Left :  Well healed surgical scar in the UOQ.  No new masses, skin change, nipple retraction or discharge, or LN     Musculoskeletal: Normal range of motion.   Lymphadenopathy:     She has no cervical adenopathy.   Neurological: She is alert and oriented to person, place, and time.   Skin: Skin is warm and dry.   Psychiatric: She has a normal mood and affect. Her behavior is normal. Judgment and thought content normal.   Pt is justifiably teary in discussing her husband's recent demise secondary to prostate cancer.      Assessment:     1. Hx of breast cancer  Mammography diagnostic bilateral     -  Senescent FSD bilateral   -  S/P stage 1 left breast cancer with breast conserving treatment 2012 - remains NED    Plan:     1.  Surgical follow up including bilateral mammogram in 1 yr  2.  Regular follow ups with Drs. Gustavus Bryant and Molly Maduro.     Orders Placed This Encounter   Procedures   . Mammography diagnostic bilateral     Please call [facility] to schedule     Standing Status: Future      Number of Occurrences:       Standing Expiration Date: 07/03/2015     Scheduling Instructions:      Please remind patient to bring MD  order, referral or prescription with them on the day of the exam. If the order was faxed from the doctor's office to central scheduling please make sure the order is scanned into Epic.     Order Specific Question:  Reason for Exam:     Answer:  follow up for left breast cancer treated in 2012

## 2014-05-31 ENCOUNTER — Ambulatory Visit: Payer: Medicare Other | Attending: Internal Medicine

## 2014-05-31 ENCOUNTER — Other Ambulatory Visit: Payer: Self-pay | Admitting: Internal Medicine

## 2014-05-31 DIAGNOSIS — M79671 Pain in right foot: Secondary | ICD-10-CM

## 2014-05-31 DIAGNOSIS — R609 Edema, unspecified: Secondary | ICD-10-CM | POA: Insufficient documentation

## 2014-05-31 DIAGNOSIS — M722 Plantar fascial fibromatosis: Secondary | ICD-10-CM | POA: Insufficient documentation

## 2014-05-31 DIAGNOSIS — M79609 Pain in unspecified limb: Secondary | ICD-10-CM | POA: Insufficient documentation

## 2014-10-19 ENCOUNTER — Emergency Department: Payer: Medicare Other

## 2014-10-19 ENCOUNTER — Emergency Department
Admission: EM | Admit: 2014-10-19 | Discharge: 2014-10-19 | Disposition: A | Discharge status: Home / Self Care | Payer: Medicare Other | Attending: Emergency Medicine | Admitting: Emergency Medicine

## 2014-10-19 DIAGNOSIS — S0992XA Unspecified injury of nose, initial encounter: Secondary | ICD-10-CM | POA: Insufficient documentation

## 2014-10-19 DIAGNOSIS — W19XXXA Unspecified fall, initial encounter: Secondary | ICD-10-CM

## 2014-10-19 DIAGNOSIS — Z7982 Long term (current) use of aspirin: Secondary | ICD-10-CM | POA: Insufficient documentation

## 2014-10-19 DIAGNOSIS — W010XXA Fall on same level from slipping, tripping and stumbling without subsequent striking against object, initial encounter: Secondary | ICD-10-CM | POA: Insufficient documentation

## 2014-10-19 DIAGNOSIS — J302 Other seasonal allergic rhinitis: Secondary | ICD-10-CM | POA: Insufficient documentation

## 2014-10-19 DIAGNOSIS — I10 Essential (primary) hypertension: Secondary | ICD-10-CM | POA: Insufficient documentation

## 2014-10-19 DIAGNOSIS — Y92511 Restaurant or cafe as the place of occurrence of the external cause: Secondary | ICD-10-CM | POA: Insufficient documentation

## 2014-10-19 MED ORDER — OXYMETAZOLINE HCL 0.05 % NA SOLN
2.0000 | Freq: Once | NASAL | Status: DC
Start: 2014-10-19 — End: 2014-10-20
  Administered 2014-10-19: 2 via NASAL
  Filled 2014-10-19: qty 15

## 2014-10-19 NOTE — ED Notes (Signed)
Patient is resting comfortably. Walking around ED without diff.

## 2014-10-19 NOTE — ED Provider Notes (Signed)
Physician/Midlevel provider first contact with patient: 10/19/14 2203         History     Chief Complaint   Patient presents with   . mouth/nose pain s/p fall     HPI Comments: mechianical fall.  Reports fell onto face and maybe broke a bit with hands and knees.  Pain only at nose.  No LOC, no neck pain.  No AMS, no loose teeth, no trouble breathing.    Patient is a 74 y.o. female presenting with fall. The history is provided by the patient.   Fall  The accident occurred 1 to 2 hours ago. Fall occurred: tripped on transition point on floor in restuarant. She landed on a hard floor. Blood loss: nose only. Point of impact: face. Pain location: nose. The pain is at a severity of 2/10. The pain is mild. She was ambulatory at the scene. There was no entrapment after the fall. There was no drug use involved in the accident. Pertinent negatives include no visual change, no fever, no numbness, no abdominal pain, no bowel incontinence, no nausea, no vomiting, no hematuria, no headaches, no hearing loss, no loss of consciousness and no tingling. The symptoms are aggravated by activity. She has tried ice for the symptoms.        Nursing (triage) note reviewed for the following pertinent information:         Past Medical History   Diagnosis Date   . Cancer    . Hypertensive disorder    . Seasonal allergies    . Breast cancer        Past Surgical History   Procedure Laterality Date   . Breast surgery Left 01/2011     well diff CA 0.8 cm Grade 1 (T1) N0MX  ER +   . Hysterectomy         Family History   Problem Relation Age of Onset   . Colon cancer Mother 64     Deceased   . Cancer Father    . Cancer Other 23     prostate       Social  History   Substance Use Topics   . Smoking status: Never Smoker    . Smokeless tobacco: Not on file   . Alcohol Use: No       .     Allergies   Allergen Reactions   . Erythromycin    . Morphine    . Penicillins    . Tetracycline    . Levofloxacin    . Naprosyn [Naproxen]    . Sulfa Antibiotics         Current/Home Medications    ALBUTEROL (PROVENTIL HFA;VENTOLIN HFA) 108 (90 BASE) MCG/ACT INHALER    Inhale 2 puffs into the lungs.    ANASTROZOLE (ARIMIDEX) 1 MG TABLET    Take 1 mg by mouth daily.    ASPIRIN 81 MG TABLET    Take 81 mg by mouth daily.      BISOPROLOL (ZEBETA) 10 MG TABLET    Take 5 mg by mouth daily.     CALCIUM CARBONATE-VITAMIN D (CALTRATE 600+D PO)    Take by mouth.    CHOLECALCIFEROL (VITAMIN D) 1000 UNIT TABLET    Take 1,000 Units by mouth daily.    GLUCOSAMINE-CHONDROITIN PO    Take by mouth.    IBUPROFEN (ADVIL,MOTRIN) 200 MG TABLET    Take 200 mg by mouth every 6 (six) hours as needed.  LORATADINE (CLARITIN) 10 MG TABLET    Take 10 mg by mouth daily.      MINOCYCLINE (MINOCIN,DYNACIN) 100 MG CAPSULE    Take 100 mg by mouth 2 (two) times daily.    RANITIDINE (ZANTAC) 150 MG TABLET    Take 150 mg by mouth 2 (two) times daily.    RISEDRONATE (ACTONEL) 35 MG TABLET    Take 35 mg by mouth every 7 days. with water on empty stomach, nothing by mouth or lie down for next 30 minutes.        Review of Systems   Constitutional: Negative for fever.   Gastrointestinal: Negative for nausea, vomiting, abdominal pain and bowel incontinence.   Genitourinary: Negative for hematuria.   Neurological: Negative for tingling, loss of consciousness, numbness and headaches.   All other systems reviewed and are negative.      Physical Exam    BP: (!) 142/102 mmHg, Heart Rate: 80, Temp: 98 F (36.7 C), Resp Rate: 16, SpO2: 98 %, Weight: 59.875 kg    Physical Exam   Constitutional: She is oriented to person, place, and time. She appears well-developed and well-nourished. No distress.   HENT:   Head: Normocephalic and atraumatic.   Right Ear: External ear normal.   Left Ear: External ear normal.   Mouth/Throat: Oropharynx is clear and moist.   Mild diffuse edema to bridge of nose.  Mild bleeding R nare.  No septal hematoma, no gross deformity, breathing comfortably.  All teeth intact.  Small lesion above  upper lip, NL frenulum.  No open wounds.   Eyes: Conjunctivae are normal. Right eye exhibits no discharge. Left eye exhibits no discharge.   Neck: Normal range of motion. Neck supple.   No cspine TTP, deformity or step off   Cardiovascular: Normal rate, regular rhythm and normal heart sounds.  Exam reveals no friction rub.    No murmur heard.  Pulmonary/Chest: Effort normal and breath sounds normal. No respiratory distress. She has no wheezes. She has no rales. She exhibits no tenderness.   Abdominal: Soft. Bowel sounds are normal. She exhibits no distension. There is no tenderness. There is no rebound and no guarding.   Musculoskeletal: Normal range of motion. She exhibits no edema or tenderness.   Neurological: She is alert and oriented to person, place, and time.   Skin: Skin is warm and dry. No rash noted. She is not diaphoretic. No erythema. No pallor.   Psychiatric: She has a normal mood and affect. Her behavior is normal.   Nursing note and vitals reviewed.        MDM and ED Course     ED Medication Orders     Start     Status Ordering Provider    10/19/14 2211  oxymetazoline (AFRIN) 0.05 % nasal spray 2 spray   Once     Route: Each Nare  Ordered Dose: 2 spray     Last MAR action:  Given Bion Todorov R              MDM  Number of Diagnoses or Management Options  Diagnosis management comments: No high risk featuers, completely intact, obs here, no active bleeding.  Friend at bedisde.  Ambulated prior to d/c.  Patient retired Engineer, civil (consulting), understands and agrees with plan.         Procedures    Clinical Impression & Disposition     Clinical Impression  Final diagnoses:   Fall, initial encounter  ED Disposition     Discharge [facility] Moosman discharge to home/self care.    Condition at disposition: Stable             New Prescriptions    No medications on file                   Leanora Ivanoff, MD  10/19/14 2250

## 2014-10-19 NOTE — Discharge Instructions (Signed)
Fall Prevention (Edu)     You asked for information on Fall Prevention.     There was a 2003 study from the Journal of the [employer] Ger[employer] on fall-related injuries. It shows that more than 1.8 million adults, aged 76 and older, were treated in emergency departments for such injuries. More than 421,000 were hospitalized. The most common fall injuries are head injuries. These in turn cause brain injury and fractures (broken bones). Hip fractures are the most serious types of broken bones that happen from falls. They lead to the most health problems and deaths.     To make the living area safer, older adults should:  · Improve lighting throughout the home. Use night-lights to help see at night.  · Have handrails put in on both sides of stairways.  · Have grab bars put next to the toilet and in the shower. Also think about getting an elevated (high) toilet seat and a shower chair.  · Use non-slip bath mats in the tub or shower.  · Take out "throw rugs" to prevent tripping.  · Avoid long robes to prevent tripping.  · Wear well-fitted shoes or slippers. Loose footwear can make you shuffle. This makes you more likely to trip and fall. You can also buy inexpensive anti-slip socks.  · Keep all electrical cords and small objects out of the pathway.  · Any cane, walker or other assistive device used needs to be checked regularly. The devices must be used correctly to prevent injuries.  · Move about at a pace that is comfortable for your ability. For example, do not rush to answer the doorbell or phone. Take your time.     Recent studies have identified some risk factors that make older adults more likely to have falls. Changing these risk factors helps to prevent falls.  · Exercise: Regular physical activity or exercise make the body stronger. They also improve balance.  · Medicine Review: Follow up with your doctor and pharmacist as needed to review your medicines and any new changes. They can tell you if there  are side-effects or drug interactions (if the medicines affect other medicines you are taking). If you are taking sedatives or sleeping pills, it may be possible to lower the dosage or number of medicines. These kinds of medicines can cause drowsiness and dizziness. This makes it more likely you will fall.  · Vision Checks: Follow up with an eye doctor at least once a year to have your vision checked.

## 2014-10-19 NOTE — ED Notes (Signed)
Here with c/o mouth/nose/bil knee pain s/p fall. States had finished eating at restaurant, walking out, tripped over slight rise in floor. A&O x 3. Denies loc. Denies n/v/d,.

## 2015-01-07 ENCOUNTER — Encounter (INDEPENDENT_AMBULATORY_CARE_PROVIDER_SITE_OTHER): Payer: Self-pay | Admitting: Surgery

## 2015-01-31 ENCOUNTER — Encounter (INDEPENDENT_AMBULATORY_CARE_PROVIDER_SITE_OTHER): Payer: Self-pay | Admitting: Surgery

## 2015-04-03 ENCOUNTER — Encounter (INDEPENDENT_AMBULATORY_CARE_PROVIDER_SITE_OTHER): Payer: Self-pay | Admitting: Family Medicine

## 2015-04-03 ENCOUNTER — Ambulatory Visit (INDEPENDENT_AMBULATORY_CARE_PROVIDER_SITE_OTHER): Payer: Medicare Other

## 2015-04-03 ENCOUNTER — Ambulatory Visit (INDEPENDENT_AMBULATORY_CARE_PROVIDER_SITE_OTHER): Payer: Medicare Other | Admitting: Family Medicine

## 2015-04-03 VITALS — BP 160/72 | HR 52 | Temp 98.3°F | Resp 16 | Ht 65.5 in | Wt 131.0 lb

## 2015-04-03 DIAGNOSIS — L03116 Cellulitis of left lower limb: Secondary | ICD-10-CM

## 2015-04-03 DIAGNOSIS — S99912A Unspecified injury of left ankle, initial encounter: Secondary | ICD-10-CM

## 2015-04-03 MED ORDER — CEPHALEXIN 500 MG PO CAPS
500.0000 mg | ORAL_CAPSULE | Freq: Three times a day (TID) | ORAL | Status: AC
Start: 2015-04-03 — End: 2015-04-13

## 2015-04-03 NOTE — Progress Notes (Signed)
Subjective:       Patient ID: [name] is a 74 y.o. female.    HPI   Pain in LT lateral ankle.  Slipped on a carpeted step in her home on 03/22/15 and landed on an inverted LT ankle.  Has been icing and elevating it, but it is still painful at a 5/10 severity.  Has had to do a lot of work around the house and yard after being widowed 16months ago, so it has been difficult to rest the LT foot.  Associated with swelling and stiffness.  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  Currently seeing PT for other issues and had the physical therapist evaluate the LT ankle.  They did not think it was fractured.  She is here today because the pain has persisted.    The following portions of the patient's history were reviewed and updated as appropriate: allergies, current medications, past family history, past medical history, past social history, past surgical history and problem list.    Review of Systems   Constitutional: Negative for fever and chills.   Musculoskeletal: Positive for joint swelling, arthralgias and gait problem.   Skin: Positive for color change. Negative for pallor.   Neurological: Negative for weakness and numbness.           Objective:     Physical Exam   Vitals reviewed.  Constitutional: She is oriented to person, place, and time. She appears well-developed and well-nourished. No distress.   HENT:   Head: Normocephalic and atraumatic.   Eyes: EOM are normal. Right eye exhibits no discharge. Left eye exhibits discharge.   Pulmonary/Chest: Effort normal. No respiratory distress.   Musculoskeletal: She exhibits edema and tenderness.   LT lateral ankle is swollen and warm to the touch; mild redness extending proximally up the LT leg; there is tenderness surrounding the lateral malleolus; stiff ROM of the LT ankle   Neurological: She is alert and oriented to person, place, and time.   Skin: Skin is warm and dry. There is erythema. No pallor.   Psychiatric: She has a normal  mood and affect. Her behavior is normal. Thought content normal.     X-ray Ankle Left  3 Views    04/03/2015    Minimal tarsal osteophytic changes. Otherwise the rest of the examination was within normal limits.  Georgana Curio, MD  04/03/2015 9:57 AM     Xray films independently reviewed by me and no fractures seen.        Assessment:       Ankle sprain - LT  LT ankle/leg cellultisi       Plan:       Air cast  Limit excessive weight bearing and activity on LT foot   Keflex  F/U with ortho if pain doe not gradually improve with rest over the next week  F/U with PCP if redness does not improve.

## 2015-04-03 NOTE — Patient Instructions (Signed)
Cellulitis  You have an infection of the skin known as cellulitis. This usually starts with a scrape, cut, insect bite, blister or other opening in the skin which becomes infected. This is a serious condition. It must be watched closely to be sure the infection is not spreading.  With antibiotic treatment, the size of the red area will gradually shrink in size until the skin returns to normal. This will take 7-10 days.  The red area should never increase in size once the antibiotic medicine has been started. Occasionally, an infection will be resistant to one antibiotic and another one will have to be used.  Home Care:  1) Limit the use of the affected part, since excess movement can cause the infection to spread.  2) If the infection is on your leg, walk as little as possible during the first few days of the treatment. Keep your leg elevated while sitting. This will reduce swelling.  3) Take all of the antibiotic medicine exactly as directed until it is gone. Be careful not to miss any doses, especially during the first seven days.  Follow Up  with your doctor or this facility as directed. Check the infected area daily for the warning signs listed below.  Get Prompt Medical Attention  if any of the following occur:  -- Spreading area of redness  -- Increasing swelling or pain  -- Appearance of pus or drainage  -- Fever over 100.4 F (38.0 C) oral, or over 101.4 F (38.6 C) rectal, after two days on antibiotics   2000-2015 [employer] [address], [place] [address]. All rights reserved. This information is not intended as a substitute for professional medical care. Always follow your healthcare professional's instructions.        Treating Ankle Sprains  Treatment will depend on how bad your sprain is. For a severe sprain, healing may take 3 months or more.  Right After Your Injury: Use R.I.C.E.  Rest: At first, keep weight off the ankle as much as you can. You may be given crutches to  help you walk without putting weight on the ankle.  Ice: Put an ice pack on the ankle for 15 minutes. Remove the pack and wait at least 30 minutes. Repeat for up to 3 days. This helps reduce swelling.  Compression: To reduce swelling and keep the joint stable, you may need to wrap the ankle with an elastic bandage. For more severe sprains, you may need an ankle brace or a cast.  Elevation: To reduce swelling, keep your ankle raised above your heart when you sit or lie down.  Medication  Your doctor may suggest an oral anti-inflammatory medicine, such as ibuprofen. This relieves the pain and helps reduce any swelling. Be sure to take your medicine as directed.  Contrast baths  After 3 days, soak your ankle in warm water for 30 seconds, then in cool water for 30 seconds. Go back and forth for 5 minutes. Doing this every 2 hours will help keep the swelling down.      2000-2015 [employer] [address], [place] [address]. All rights reserved. This information is not intended as a substitute for professional medical care. Always follow your healthcare professional's instructions.

## 2015-05-03 ENCOUNTER — Ambulatory Visit (INDEPENDENT_AMBULATORY_CARE_PROVIDER_SITE_OTHER): Payer: Medicare Other | Admitting: Surgery

## 2015-05-21 ENCOUNTER — Telehealth (INDEPENDENT_AMBULATORY_CARE_PROVIDER_SITE_OTHER): Payer: Self-pay

## 2015-05-21 ENCOUNTER — Ambulatory Visit (INDEPENDENT_AMBULATORY_CARE_PROVIDER_SITE_OTHER): Payer: Medicare Other | Admitting: Surgery

## 2015-05-21 ENCOUNTER — Encounter (INDEPENDENT_AMBULATORY_CARE_PROVIDER_SITE_OTHER): Payer: Self-pay | Admitting: Surgery

## 2015-05-21 VITALS — BP 155/72 | HR 62 | Temp 98.1°F | Ht 65.0 in | Wt 129.0 lb

## 2015-05-21 DIAGNOSIS — Z853 Personal history of malignant neoplasm of breast: Secondary | ICD-10-CM

## 2015-05-21 NOTE — Telephone Encounter (Signed)
I chaperoned with Dr. Derek Mound during this patients exam 05/21/2015

## 2015-06-05 ENCOUNTER — Encounter (INDEPENDENT_AMBULATORY_CARE_PROVIDER_SITE_OTHER): Payer: Self-pay | Admitting: Surgery

## 2015-06-05 NOTE — Progress Notes (Signed)
Subjective:   [name] is a 74 y.o. 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  She remains asymptomatic.     Past Medical History   Diagnosis Date   . Cancer    . Hypertensive disorder    . Seasonal allergies    . Breast cancer      Past Surgical History   Procedure Laterality Date   . Breast surgery Left 01/2011     well diff CA 0.8 cm Grade 1 (T1) N0MX  ER +   . Hysterectomy       Family History   Problem Relation Age of Onset   . Colon cancer Mother 24     Deceased   . Cancer Father    . Cancer Other 73     prostate     Erythromycin; Morphine; Penicillins; Tetracycline; Levofloxacin; Naprosyn; and Sulfa antibiotics: allergies  Current Outpatient Prescriptions   Medication Sig Dispense Refill   . albuterol (PROVENTIL HFA;VENTOLIN HFA) 108 (90 BASE) MCG/ACT inhaler Inhale 2 puffs into the lungs.     . ALPRAZolam (XANAX PO) Take by mouth.     Marland Kitchen anastrozole (ARIMIDEX) 1 MG tablet Take 1 mg by mouth daily.     Marland Kitchen aspirin 81 MG tablet Take 81 mg by mouth daily.       . bisoprolol (ZEBETA) 10 MG tablet Take 5 mg by mouth daily.      . Calcium Carbonate-Vitamin D (CALTRATE 600+D PO) Take by mouth.     . Cholecalciferol (VITAMIN D) 1000 UNIT tablet Take 1,000 Units by mouth daily.     Marland Kitchen GLUCOSAMINE-CHONDROITIN PO Take by mouth.     Marland Kitchen ibuprofen (ADVIL,MOTRIN) 200 MG tablet Take 200 mg by mouth every 6 (six) hours as needed.       . loratadine (CLARITIN) 10 MG tablet Take 10 mg by mouth daily.       . minocycline (MINOCIN,DYNACIN) 100 MG capsule Take 100 mg by mouth 2 (two) times daily.     . ranitidine (ZANTAC) 150 MG tablet Take 150 mg by mouth 2 (two) times daily.     . risedronate (ACTONEL) 35 MG tablet Take 35 mg by mouth every 7 days. with water on empty stomach, nothing by mouth or lie down for next 30 minutes.     . sertraline (ZOLOFT) 25 MG tablet Take 25 mg by mouth daily.       Review of Systems   All other systems reviewed  and are negative.    Objective:     Bilateral diagnostic Mammogram 4.18.16:  Birads Category 2 - benign findings.     Filed Vitals:    05/21/15 1535   BP: 155/72   Pulse: 62   Temp: 98.1 F (36.7 C)   SpO2: 99%     Physical Exam   Constitutional: She is oriented to person, place, and time. She appears well-developed and well-nourished.   HENT:   Head: Normocephalic and atraumatic.   Eyes: Pupils are equal, round, and reactive to light.   Neck: Normal range of motion. No thyromegaly present.   Cardiovascular: Normal rate and regular rhythm.    Pulmonary/Chest: Effort normal.   Left and right breasts:  FSD;  No new skin or nipple changes; no discharge; no new worrisome masses or axillary LN.    Musculoskeletal: Normal range of motion.   Lymphadenopathy:     She has no  cervical adenopathy.   Neurological: She is alert and oriented to person, place, and time.   Skin: Skin is warm and dry.   Psychiatric: She has a normal mood and affect. Her behavior is normal. Judgment and thought content normal.     Assessment:     1. Hx of breast cancer       -   Stable breast exam clinically and radiographically     Plan:     1.  Surgical follow up as needed now that the patient is 5 years post treatment  2.  Continue regular self breast exam and yearly bilateral diagnostic mammography  3.  Continue regular follow up with Drs. Dareen Piano and [employer].

## 2016-02-07 ENCOUNTER — Encounter (INDEPENDENT_AMBULATORY_CARE_PROVIDER_SITE_OTHER): Payer: Self-pay

## 2016-07-15 ENCOUNTER — Other Ambulatory Visit: Payer: Self-pay | Admitting: Internal Medicine

## 2016-11-30 ENCOUNTER — Ambulatory Visit: Payer: Medicare Other

## 2016-12-03 ENCOUNTER — Ambulatory Visit: Payer: Medicare Other | Attending: Orthopaedic Surgery

## 2016-12-03 NOTE — Pre-Procedure Instructions (Signed)
No Cardiac or Respiratory events within past 6months.  Orders faxed to pharmacy  Faxed surgeon for Pre-ops when available, PCP appointment was yesterday.  Patient scheduled for JIm class.

## 2016-12-18 NOTE — Progress Notes (Signed)
Pt came to pre-op JIM class on 2/26 with coach at side. Completed post-test with no deficiencies. Opportunity for questions during and after class; pt and coach verbalized understanding of material. Surgery scheduled for 3/12 with Dr. Anise Salvo

## 2016-12-21 ENCOUNTER — Ambulatory Visit: Payer: Medicare Other

## 2016-12-21 ENCOUNTER — Ambulatory Visit: Payer: Self-pay

## 2016-12-21 ENCOUNTER — Inpatient Hospital Stay: Payer: Medicare Other | Admitting: Certified Registered"

## 2016-12-21 ENCOUNTER — Inpatient Hospital Stay
Admission: RE | Admit: 2016-12-21 | Discharge: 2016-12-24 | DRG: 470 | Disposition: A | Discharge status: Home Health | Payer: Medicare Other | Source: Ambulatory Visit | Attending: Orthopaedic Surgery | Admitting: Orthopaedic Surgery

## 2016-12-21 ENCOUNTER — Inpatient Hospital Stay: Payer: Self-pay

## 2016-12-21 ENCOUNTER — Encounter
Admission: RE | Disposition: A | Discharge status: Home Health | Payer: Self-pay | Source: Ambulatory Visit | Attending: Orthopaedic Surgery

## 2016-12-21 ENCOUNTER — Inpatient Hospital Stay: Payer: Medicare Other | Admitting: Orthopaedic Surgery

## 2016-12-21 DIAGNOSIS — M1712 Unilateral primary osteoarthritis, left knee: Secondary | ICD-10-CM

## 2016-12-21 DIAGNOSIS — Z79899 Other long term (current) drug therapy: Secondary | ICD-10-CM

## 2016-12-21 DIAGNOSIS — Z853 Personal history of malignant neoplasm of breast: Secondary | ICD-10-CM

## 2016-12-21 DIAGNOSIS — J45909 Unspecified asthma, uncomplicated: Secondary | ICD-10-CM | POA: Diagnosis present

## 2016-12-21 DIAGNOSIS — Z7982 Long term (current) use of aspirin: Secondary | ICD-10-CM

## 2016-12-21 DIAGNOSIS — K219 Gastro-esophageal reflux disease without esophagitis: Secondary | ICD-10-CM | POA: Diagnosis present

## 2016-12-21 DIAGNOSIS — I10 Essential (primary) hypertension: Secondary | ICD-10-CM | POA: Diagnosis present

## 2016-12-21 HISTORY — DX: Unilateral primary osteoarthritis, left knee: M17.12

## 2016-12-21 HISTORY — DX: Unspecified osteoarthritis, unspecified site: M19.90

## 2016-12-21 HISTORY — PX: ARTHROPLASTY, KNEE, TOTAL, COMPUTER NAVIGATION: SHX3136

## 2016-12-21 HISTORY — DX: Gastro-esophageal reflux disease without esophagitis: K21.9

## 2016-12-21 HISTORY — DX: Unspecified asthma, uncomplicated: J45.909

## 2016-12-21 LAB — TYPE AND SCREEN
AB Screen Gel: NEGATIVE
ABO Rh: O NEG

## 2016-12-21 SURGERY — ARTHROPLASTY, KNEE, TOTAL, COMPUTER NAVIGATION
Anesthesia: Regional | Site: Knee | Laterality: Left | Wound class: Clean

## 2016-12-21 MED ORDER — ASPIRIN 325 MG PO TBEC
325.0000 mg | DELAYED_RELEASE_TABLET | Freq: Two times a day (BID) | ORAL | Status: DC
Start: 2016-12-22 — End: 2016-12-24
  Administered 2016-12-22 – 2016-12-24 (×4): 325 mg via ORAL
  Filled 2016-12-21 (×5): qty 1

## 2016-12-21 MED ORDER — GABAPENTIN 300 MG PO CAPS
ORAL_CAPSULE | ORAL | Status: AC
Start: 2016-12-21 — End: ?
  Filled 2016-12-21: qty 1

## 2016-12-21 MED ORDER — ACETAMINOPHEN 500 MG PO TABS
1000.0000 mg | ORAL_TABLET | Freq: Three times a day (TID) | ORAL | Status: DC
Start: 2016-12-21 — End: 2016-12-22
  Administered 2016-12-21 (×2): 1000 mg via ORAL
  Filled 2016-12-21 (×4): qty 2

## 2016-12-21 MED ORDER — ON-Q PUMP SINGLE FLOW
Status: AC
Start: 2016-12-21 — End: 2016-12-21
  Filled 2016-12-21: qty 550

## 2016-12-21 MED ORDER — HYDROXYZINE PAMOATE 25 MG PO CAPS
25.0000 mg | ORAL_CAPSULE | Freq: Two times a day (BID) | ORAL | Status: DC | PRN
Start: 2016-12-21 — End: 2016-12-24

## 2016-12-21 MED ORDER — DEXTROSE 5 % IV SOLN
2.0000 g | INTRAVENOUS | Status: AC
Start: 2016-12-21 — End: 2016-12-21
  Administered 2016-12-21: 08:00:00 2 g via INTRAVENOUS

## 2016-12-21 MED ORDER — POLYMYXIN B SULFATE 500000 UNITS IJ SOLR
INTRAMUSCULAR | Status: AC
Start: 2016-12-21 — End: ?
  Filled 2016-12-21: qty 500000

## 2016-12-21 MED ORDER — NALOXONE HCL 0.4 MG/ML IJ SOLN (WRAP)
0.4000 mg | INTRAMUSCULAR | Status: DC | PRN
Start: 2016-12-21 — End: 2016-12-24

## 2016-12-21 MED ORDER — TRANEXAMIC ACID-NACL 1000-0.9 MG/100ML-% IV SOLN (SIMPLE - CNR)
1000.0000 mg | Freq: Once | INTRAVENOUS | Status: AC
Start: 2016-12-21 — End: 2016-12-21
  Administered 2016-12-21 (×2): 1000 mg via INTRAVENOUS

## 2016-12-21 MED ORDER — HYDRALAZINE HCL 20 MG/ML IJ SOLN
10.0000 mg | Freq: Four times a day (QID) | INTRAMUSCULAR | Status: DC | PRN
Start: 2016-12-21 — End: 2016-12-24

## 2016-12-21 MED ORDER — MONTELUKAST SODIUM 10 MG PO TABS
10.0000 mg | ORAL_TABLET | Freq: Every evening | ORAL | Status: DC
Start: 2016-12-21 — End: 2016-12-24
  Administered 2016-12-21 – 2016-12-23 (×3): 10 mg via ORAL
  Filled 2016-12-21 (×3): qty 1

## 2016-12-21 MED ORDER — LABETALOL HCL 5 MG/ML IV SOLN
INTRAVENOUS | Status: AC
Start: 2016-12-21 — End: 2016-12-21
  Administered 2016-12-21: 10:00:00 10 mg via INTRAVENOUS
  Filled 2016-12-21: qty 20

## 2016-12-21 MED ORDER — LIDOCAINE HCL (PF) 1 % IJ SOLN
INTRAMUSCULAR | Status: DC | PRN
Start: 2016-12-21 — End: 2016-12-21
  Administered 2016-12-21: 5 mL via INTRAVENOUS

## 2016-12-21 MED ORDER — VITAMINS/MINERALS PO TABS
1.0000 | ORAL_TABLET | Freq: Every day | ORAL | Status: DC
Start: 2016-12-21 — End: 2016-12-24
  Administered 2016-12-21 – 2016-12-24 (×3): 1 via ORAL
  Filled 2016-12-21 (×4): qty 1

## 2016-12-21 MED ORDER — CETIRIZINE HCL 10 MG PO TABS
10.0000 mg | ORAL_TABLET | Freq: Every day | ORAL | Status: DC
Start: 2016-12-22 — End: 2016-12-24
  Administered 2016-12-23 – 2016-12-24 (×2): 10 mg via ORAL
  Filled 2016-12-21 (×2): qty 1

## 2016-12-21 MED ORDER — SODIUM CHLORIDE 0.9 % IV SOLN
INTRAVENOUS | Status: DC
Start: 2016-12-21 — End: 2016-12-24

## 2016-12-21 MED ORDER — LACTATED RINGERS IR SOLN
Status: DC | PRN
Start: 2016-12-21 — End: 2016-12-21
  Administered 2016-12-21: 3000 mL

## 2016-12-21 MED ORDER — MEPIVACAINE HCL 1.5 % IJ SOLN
INTRAMUSCULAR | Status: DC | PRN
Start: 2016-12-21 — End: 2016-12-21
  Administered 2016-12-21: 3 mL via EPIDURAL

## 2016-12-21 MED ORDER — ON-Q PUMP SINGLE FLOW
Status: DC
Start: 2016-12-21 — End: 2016-12-23
  Filled 2016-12-21: qty 550

## 2016-12-21 MED ORDER — FENTANYL CITRATE (PF) 50 MCG/ML IJ SOLN (WRAP)
INTRAMUSCULAR | Status: AC
Start: 2016-12-21 — End: 2016-12-21
  Administered 2016-12-21: 10:00:00 25 ug via INTRAVENOUS
  Filled 2016-12-21: qty 2

## 2016-12-21 MED ORDER — GABAPENTIN 300 MG PO CAPS
300.0000 mg | ORAL_CAPSULE | ORAL | Status: AC
Start: 2016-12-21 — End: 2016-12-21
  Administered 2016-12-21: 06:00:00 300 mg via ORAL

## 2016-12-21 MED ORDER — MEPERIDINE HCL 25 MG/ML IJ SOLN
25.0000 mg | Freq: Once | INTRAMUSCULAR | Status: DC
Start: 2016-12-21 — End: 2016-12-21

## 2016-12-21 MED ORDER — TRANEXAMIC ACID-NACL 1000-0.9 MG/100ML-% IV SOLN (SIMPLE - CNR)
INTRAVENOUS | Status: AC
Start: 2016-12-21 — End: ?
  Filled 2016-12-21: qty 200

## 2016-12-21 MED ORDER — SODIUM CHLORIDE BACTERIOSTATIC 0.9 % IJ SOLN
INTRAMUSCULAR | Status: DC | PRN
Start: 2016-12-21 — End: 2016-12-21
  Administered 2016-12-21: 20 mL

## 2016-12-21 MED ORDER — HYDROMORPHONE HCL 1 MG/ML IJ SOLN
INTRAMUSCULAR | Status: AC
Start: 2016-12-21 — End: ?
  Filled 2016-12-21: qty 1

## 2016-12-21 MED ORDER — PROPOFOL 10 MG/ML IV EMUL (WRAP)
INTRAVENOUS | Status: AC
Start: 2016-12-21 — End: ?
  Filled 2016-12-21: qty 40

## 2016-12-21 MED ORDER — MEPIVACAINE HCL 1.5 % IJ SOLN
INTRAMUSCULAR | Status: AC
Start: 2016-12-21 — End: ?
  Filled 2016-12-21: qty 30

## 2016-12-21 MED ORDER — PROMETHAZINE HCL 25 MG/ML IJ SOLN
6.2500 mg | Freq: Once | INTRAMUSCULAR | Status: DC | PRN
Start: 2016-12-21 — End: 2016-12-21

## 2016-12-21 MED ORDER — EPINEPHRINE HCL 1 MG/ML IJ SOLN (WRAP)
Status: DC | PRN
Start: 2016-12-21 — End: 2016-12-21
  Administered 2016-12-21: .5 mg

## 2016-12-21 MED ORDER — VITAMIN C 500 MG PO TABS
500.0000 mg | ORAL_TABLET | Freq: Every day | ORAL | Status: DC
Start: 2016-12-21 — End: 2016-12-24
  Administered 2016-12-21 – 2016-12-24 (×3): 500 mg via ORAL
  Filled 2016-12-21 (×4): qty 1

## 2016-12-21 MED ORDER — OXYCODONE HCL 5 MG PO TABS
5.0000 mg | ORAL_TABLET | ORAL | Status: DC | PRN
Start: 2016-12-21 — End: 2016-12-22
  Administered 2016-12-21: 5 mg via ORAL
  Filled 2016-12-21: qty 1

## 2016-12-21 MED ORDER — BACITRACIN 50000 UNITS IM SOLR
INTRAMUSCULAR | Status: AC
Start: 2016-12-21 — End: ?
  Filled 2016-12-21: qty 50000

## 2016-12-21 MED ORDER — OXYCODONE HCL ER 10 MG PO T12A
10.0000 mg | EXTENDED_RELEASE_TABLET | ORAL | Status: AC
Start: 2016-12-21 — End: 2016-12-21
  Administered 2016-12-21: 06:00:00 10 mg via ORAL

## 2016-12-21 MED ORDER — PROPOFOL 10 MG/ML IV EMUL (WRAP)
INTRAVENOUS | Status: AC
Start: 2016-12-21 — End: ?
  Filled 2016-12-21: qty 60

## 2016-12-21 MED ORDER — CEFAZOLIN SODIUM 10 G IJ SOLR
INTRAMUSCULAR | Status: AC
Start: 2016-12-21 — End: ?
  Filled 2016-12-21: qty 2000

## 2016-12-21 MED ORDER — TRAMADOL HCL 50 MG PO TABS
50.0000 mg | ORAL_TABLET | Freq: Four times a day (QID) | ORAL | Status: DC
Start: 2016-12-21 — End: 2016-12-24
  Administered 2016-12-21 – 2016-12-24 (×12): 50 mg via ORAL
  Filled 2016-12-21 (×14): qty 1

## 2016-12-21 MED ORDER — ROPIVACAINE HCL 5 MG/ML IJ SOLN
INTRAMUSCULAR | Status: DC | PRN
Start: 2016-12-21 — End: 2016-12-21
  Administered 2016-12-21: 20 mL

## 2016-12-21 MED ORDER — ACETAMINOPHEN 500 MG PO TABS
1000.0000 mg | ORAL_TABLET | ORAL | Status: AC
Start: 2016-12-21 — End: 2016-12-21
  Administered 2016-12-21: 06:00:00 1000 mg via ORAL

## 2016-12-21 MED ORDER — PROMETHAZINE HCL 25 MG PO TABS
25.0000 mg | ORAL_TABLET | Freq: Four times a day (QID) | ORAL | Status: DC | PRN
Start: 2016-12-21 — End: 2016-12-24

## 2016-12-21 MED ORDER — PROPOFOL INFUSION 10 MG/ML
INTRAVENOUS | Status: DC | PRN
Start: 2016-12-21 — End: 2016-12-21
  Administered 2016-12-21: 50 ug/kg/min via INTRAVENOUS

## 2016-12-21 MED ORDER — POLYMYXIN B SULFATE 500000 UNITS IJ SOLR
INTRAMUSCULAR | Status: DC | PRN
Start: 2016-12-21 — End: 2016-12-21
  Administered 2016-12-21: 500000 [IU]

## 2016-12-21 MED ORDER — OXYCODONE HCL 5 MG PO TABS
10.0000 mg | ORAL_TABLET | ORAL | Status: DC | PRN
Start: 2016-12-21 — End: 2016-12-22
  Administered 2016-12-21 – 2016-12-22 (×3): 10 mg via ORAL
  Filled 2016-12-21 (×3): qty 2

## 2016-12-21 MED ORDER — FENTANYL CITRATE (PF) 50 MCG/ML IJ SOLN (WRAP)
25.0000 ug | INTRAMUSCULAR | Status: AC | PRN
Start: 2016-12-21 — End: 2016-12-21
  Administered 2016-12-21 (×3): 25 ug via INTRAVENOUS

## 2016-12-21 MED ORDER — FENTANYL CITRATE (PF) 50 MCG/ML IJ SOLN (WRAP)
INTRAMUSCULAR | Status: AC
Start: 2016-12-21 — End: ?
  Filled 2016-12-21: qty 2

## 2016-12-21 MED ORDER — TRANEXAMIC ACID-NACL 1000-0.9 MG/100ML-% IV SOLN (SIMPLE - CNR)
1000.0000 mg | Freq: Once | INTRAVENOUS | Status: DC
Start: 2016-12-21 — End: 2016-12-21

## 2016-12-21 MED ORDER — DIAZEPAM 5 MG PO TABS
ORAL_TABLET | ORAL | Status: AC
Start: 2016-12-21 — End: 2016-12-21
  Administered 2016-12-21: 10:00:00 5 mg
  Filled 2016-12-21: qty 1

## 2016-12-21 MED ORDER — HYDROMORPHONE HCL 1 MG/ML IJ SOLN
INTRAMUSCULAR | Status: DC | PRN
Start: 2016-12-21 — End: 2016-12-21
  Administered 2016-12-21: 1 mg via INTRAVENOUS

## 2016-12-21 MED ORDER — MIDAZOLAM HCL 2 MG/2ML IJ SOLN
2.0000 mg | Freq: Once | INTRAMUSCULAR | Status: DC
Start: 2016-12-21 — End: 2016-12-21

## 2016-12-21 MED ORDER — PROPOFOL 10 MG/ML IV EMUL (WRAP)
INTRAVENOUS | Status: AC
Start: 2016-12-21 — End: ?
  Filled 2016-12-21: qty 20

## 2016-12-21 MED ORDER — OXYCODONE HCL ER 10 MG PO T12A
EXTENDED_RELEASE_TABLET | ORAL | Status: AC
Start: 2016-12-21 — End: ?
  Filled 2016-12-21: qty 1

## 2016-12-21 MED ORDER — SODIUM CHLORIDE 0.9 % IR SOLN
Status: DC | PRN
Start: 2016-12-21 — End: 2016-12-21
  Administered 2016-12-21: 1000 mL

## 2016-12-21 MED ORDER — FERROUS SULFATE 324 (65 FE) MG PO TBEC
324.0000 mg | DELAYED_RELEASE_TABLET | Freq: Every morning | ORAL | Status: DC
Start: 2016-12-21 — End: 2016-12-24
  Administered 2016-12-21 – 2016-12-24 (×3): 324 mg via ORAL
  Filled 2016-12-21 (×4): qty 1

## 2016-12-21 MED ORDER — KETOROLAC TROMETHAMINE 30 MG/ML IJ SOLN
INTRAMUSCULAR | Status: DC | PRN
Start: 2016-12-21 — End: 2016-12-21
  Administered 2016-12-21: 15 mg

## 2016-12-21 MED ORDER — ONDANSETRON 4 MG PO TBDP
4.0000 mg | ORAL_TABLET | Freq: Three times a day (TID) | ORAL | Status: DC | PRN
Start: 2016-12-21 — End: 2016-12-22

## 2016-12-21 MED ORDER — PROMETHAZINE HCL 25 MG RE SUPP
25.0000 mg | Freq: Four times a day (QID) | RECTAL | Status: DC | PRN
Start: 2016-12-21 — End: 2016-12-24

## 2016-12-21 MED ORDER — ONDANSETRON HCL 4 MG/2ML IJ SOLN
4.0000 mg | Freq: Three times a day (TID) | INTRAMUSCULAR | Status: DC | PRN
Start: 2016-12-21 — End: 2016-12-22
  Administered 2016-12-22: 07:00:00 4 mg via INTRAVENOUS
  Filled 2016-12-21: qty 2

## 2016-12-21 MED ORDER — LABETALOL HCL 5 MG/ML IV SOLN
10.0000 mg | Freq: Once | INTRAVENOUS | Status: AC
Start: 2016-12-21 — End: 2016-12-21

## 2016-12-21 MED ORDER — PROMETHAZINE HCL 25 MG/ML IJ SOLN
6.2500 mg | Freq: Four times a day (QID) | INTRAMUSCULAR | Status: DC | PRN
Start: 2016-12-21 — End: 2016-12-24

## 2016-12-21 MED ORDER — HYDROMORPHONE HCL 0.5 MG/0.5 ML IJ SOLN
0.4000 mg | INTRAMUSCULAR | Status: DC | PRN
Start: 2016-12-21 — End: 2016-12-21

## 2016-12-21 MED ORDER — LIDOCAINE HCL 1 % IJ SOLN
INTRAMUSCULAR | Status: AC
Start: 2016-12-21 — End: ?
  Filled 2016-12-21: qty 20

## 2016-12-21 MED ORDER — ROPIVACAINE HCL 5 MG/ML IJ SOLN
INTRAMUSCULAR | Status: DC | PRN
Start: 2016-12-21 — End: 2016-12-21
  Administered 2016-12-21: 20 mL via PERINEURAL
  Administered 2016-12-21: 8 mL via PERINEURAL

## 2016-12-21 MED ORDER — HYDROMORPHONE HCL 0.5 MG/0.5 ML IJ SOLN
0.5000 mg | INTRAMUSCULAR | Status: DC | PRN
Start: 2016-12-21 — End: 2016-12-24
  Administered 2016-12-22 (×2): 0.5 mg via INTRAVENOUS
  Filled 2016-12-21 (×2): qty 0.5

## 2016-12-21 MED ORDER — LACTATED RINGERS IV SOLN
INTRAVENOUS | Status: DC
Start: 2016-12-21 — End: 2016-12-21

## 2016-12-21 MED ORDER — ROPIVACAINE HCL 5 MG/ML IJ SOLN
INTRAMUSCULAR | Status: AC
Start: 2016-12-21 — End: ?
  Filled 2016-12-21: qty 20

## 2016-12-21 MED ORDER — SENNOSIDES-DOCUSATE SODIUM 8.6-50 MG PO TABS
2.0000 | ORAL_TABLET | Freq: Two times a day (BID) | ORAL | Status: DC
Start: 2016-12-21 — End: 2016-12-24
  Administered 2016-12-21 – 2016-12-24 (×6): 2 via ORAL
  Filled 2016-12-21 (×6): qty 2

## 2016-12-21 MED ORDER — BACITRACIN 50000 UNITS IM SOLR
INTRAMUSCULAR | Status: DC | PRN
Start: 2016-12-21 — End: 2016-12-21
  Administered 2016-12-21: 50000 [IU]

## 2016-12-21 MED ORDER — ACETAMINOPHEN 500 MG PO TABS
ORAL_TABLET | ORAL | Status: AC
Start: 2016-12-21 — End: ?
  Filled 2016-12-21: qty 2

## 2016-12-21 MED ORDER — FENTANYL CITRATE (PF) 50 MCG/ML IJ SOLN (WRAP)
100.0000 ug | Freq: Once | INTRAMUSCULAR | Status: AC
Start: 2016-12-21 — End: 2016-12-21
  Administered 2016-12-21 (×3): 50 ug via INTRAVENOUS

## 2016-12-21 MED ORDER — GLYCOPYRROLATE 1 MG/5ML IJ SOLN
INTRAMUSCULAR | Status: AC
Start: 2016-12-21 — End: ?
  Filled 2016-12-21: qty 5

## 2016-12-21 MED ORDER — ONDANSETRON HCL 4 MG/2ML IJ SOLN
4.0000 mg | Freq: Once | INTRAMUSCULAR | Status: DC | PRN
Start: 2016-12-21 — End: 2016-12-21

## 2016-12-21 MED ORDER — KETOROLAC TROMETHAMINE 30 MG/ML IJ SOLN
INTRAMUSCULAR | Status: AC
Start: 2016-12-21 — End: ?
  Filled 2016-12-21: qty 1

## 2016-12-21 MED ORDER — SODIUM CHLORIDE 0.9 % IV MBP
1.0000 g | Freq: Three times a day (TID) | INTRAVENOUS | Status: AC
Start: 2016-12-21 — End: 2016-12-22
  Administered 2016-12-21 (×2): 1 g via INTRAVENOUS
  Filled 2016-12-21 (×2): qty 1000

## 2016-12-21 SURGICAL SUPPLY — 67 items
ADHESIVE SKIN CLOSURE DERMABOND ADVANCED (Skin Closure) ×2 IMPLANT
ADHESIVE SKIN CLOSURE DERMABOND ADVANCED .7 ML LIQUID APPLICATOR (Skin Closure) ×2 IMPLANT
ADHESIVE SKNCLS 2 OCTYL CYNCRLT .7ML (Skin Closure) ×3
APPLCATOR CHLORAPREP 26ML (Prep) ×3 IMPLANT
BANDAGE CMPR PLSTR CTTN SFWRP 5YDX6IN LF (Procedure Accessories) ×2
BANDAGE COMPRESSION L5 YD X W6 IN ELASTIC CLIP CLOSURE POLYESTER (Procedure Accessories) ×2 IMPLANT
BANDAGE MEDLINE COMPRESSION L5 YD X W6 (Procedure Accessories) ×2 IMPLANT
BASEPLATE TIB 3 TRTHLN STRL CMNT PRM KN (Base) ×3 IMPLANT
BASEPLATE TIBIAL 3 KNEE CEMENT PRIMARY (Base) ×2 IMPLANT
BASEPLATE TIBIAL 3 KNEE CEMENT PRIMARY TRIATHLON (Base) ×2 IMPLANT
BNDG SFWRP CMPR 5YDX6IN PLSTR CTTN ELC (Procedure Accessories) ×1
CEMENT BN CBLT LF STRL HVISC 40GM (Cement) ×6 IMPLANT
CEMENT BONE HIGH VISCOSITY COBALT 40 GM (Cement) ×4 IMPLANT
COMPONENT FEM 4 TRTHLN LF STRL POST STAB (Component) ×3 IMPLANT
COMPONENT FEMORAL 4 KNEE LEFT POSTERIOR STABILIZE CEMENT TRIATHLON (Component) ×2 IMPLANT
COMPONENT FEMORAL 4 KNEE LT POSTERIOR (Component) ×2 IMPLANT
COMPONENT PATELLAR H10 MM OD32 MM (Joint) ×2 IMPLANT
COMPONENT PATELLAR H10 MM OD32 MM ASYMMETRIC TRIATHLON X3 KNEE (Joint) ×2 IMPLANT
COMPONENT PTLR X3 TRTHLN 32MM 10MM STRL (Joint) ×3 IMPLANT
DRAPE 3/4 SHEET FANFLD 52X76IN (Drape) ×9 IMPLANT
DRAPE TABLE 1 PIECE COVER BARRIER (Drape) ×2 IMPLANT
DRAPE TABLE 1 PIECE COVER BARRIER PLASTIC PEDIGO PRODUCTS, INC. CLEAR (Drape) ×2 IMPLANT
DRAPE TBL PLS STRL 1 PC CVR BRR DISP CLR (Drape) ×3
DRESSING AQUACEL AG L25 CM X W9 CM (Dressing) ×2 IMPLANT
DRESSING AQUACEL AG L25 CM X W9 CM HYDROCOLLOID ANTIMICROBIAL (Dressing) ×2 IMPLANT
DRESSING HDRCLD HDRFB PU AQCL AG 25X9CM (Dressing) ×3
GLOVE SRG NTR RBR 8 INDCTR BGL 299X103MM (Glove) ×3
GLOVE SURG BIOGEL SZ7.5 (Glove) ×9 IMPLANT
GLOVE SURGICAL 8 INDICATOR BIOGEL POWDER (Glove) ×2 IMPLANT
GLOVE SURGICAL 8 INDICATOR BIOGEL POWDER FREE SMOOTH BEAD CUFF (Glove) ×2 IMPLANT
HANDLE LGHT LF STRL ADP LGHT CNTRL + TCH (Other) ×2
HANDLE LIGHT ADAPTIVE LIGHT CONTROL PLUS (Other) ×2 IMPLANT
HANDLE LIGHT ADAPTIVE LIGHT CONTROL PLUS TECHNOLOGY SNAP ON LENS TOUCH (Other) ×2 IMPLANT
HANDPIECE INTERPLUSE HIGH FLOW (Cautery) ×3 IMPLANT
HNDL LGHT ADP LGHT CNTRL + TCH SNPON LEN (Other) ×1
HOOD T4 STERI-SHIELD (Personal Protection) ×12 IMPLANT
IMMOBILIZER KNEE UNIVERSAL L19 IN CANVAS (Immobilizer) IMPLANT
IMMOBILIZER KNEE UNIVERSAL L19 IN CANVAS ORTHOPEDIC DEROYAL OD12-24 IN (Immobilizer) IMPLANT
IMMOBILIZER ORTH CNVS UNV 12-24IN 19IN (Immobilizer)
INSERT TIB X3 3 TRTHLN 11MM LF STRL POST (Component) ×3 IMPLANT
INSERT TIBIAL 3 TRIATHLON H11 MM KNEE (Component) ×2 IMPLANT
INSERT TIBIAL 3 TRIATHLON H11 MM KNEE POSTERIOR STABILIZE BEARING X3 (Component) ×2 IMPLANT
KIT INFECTION CONTROL CUSTOM (Kits) ×3 IMPLANT
KIT INFECTION CONTROL CUSTOM IFOH03 (Kits) ×2 IMPLANT
NEEDLE SPINAL DISP 18GX3.5IN (Needles) ×3 IMPLANT
PAD KNEE SHOULDER COLD SELF SEAL THERAPEUTIC COOLTEMP L11 IN X W10 IN (Other) ×2 IMPLANT
PAD THRP UNV CLTMP 11X10IN LTX CLD SLFSL (Other) ×3 IMPLANT
PEN SRGMRK 6IN LF STRL RLR LG RSRV REG (Positioning Supplies) ×3
PEN SURGICAL MARKING MEDLINE SKIN RULER (Positioning Supplies) ×2 IMPLANT
PEN SURGICAL MARKING SKIN RULER LARGE RESERVOIR REGULAR TIP LABEL (Positioning Supplies) ×2 IMPLANT
PIN FIXATION OD3.18 MM PRELOAD L90 MM (Procedure Accessories) ×4 IMPLANT
PIN FIXATION OD3.18 MM PRELOAD L90 MM PINABALL (Procedure Accessories) ×4 IMPLANT
PIN FX PINABALL 3.18MM 90MM STRL PRELD (Procedure Accessories) ×6
SOL ALCOHOL ISOPROPYL 70% 4 OZ (Prep) ×3 IMPLANT
SPONGE GAUZE L4 IN X W4 IN 12 PLY (Sponge) ×2 IMPLANT
SPONGE GZE PLS CTTN CRTY 4X4IN LF STRL (Sponge) ×3
SUTURE ABS 1 CT1 VCL 27IN BRD COAT UD (Suture) ×9
SUTURE ABS 2-0 CT1 VCL 27IN BRD COAT UD (Suture) ×6
SUTURE COATED VICRYL 1 CT-1 L27 IN BRAID (Suture) ×6 IMPLANT
SUTURE COATED VICRYL 1 CT-1 L27 IN BRAID COATED UNDYED ABSORBABLE (Suture) ×6 IMPLANT
SUTURE COATED VICRYL 2-0 CT-1 L27 IN (Suture) ×4 IMPLANT
SUTURE COATED VICRYL 2-0 CT-1 L27 IN BRAID COATED UNDYED ABSORBABLE (Suture) ×4 IMPLANT
SUTURE MONOCRYL 4-0 PS2 27IN (Suture) ×3 IMPLANT
SYRINGE 50 ML GRADUATE NONPYROGENIC DEHP (Syringes, Needles) ×2 IMPLANT
SYRINGE 50 ML GRADUATE NONPYROGENIC DEHP FREE PVC FREE LOK MEDICAL (Syringes, Needles) ×2 IMPLANT
SYRINGE MED 50ML LL LF STRL GRAD N-PYRG (Syringes, Needles) ×3
TRAY TOTAL KNEE FFX (Pack) ×3 IMPLANT

## 2016-12-21 NOTE — Progress Notes (Signed)
Pt having severe 10/10 pain in left knee after receiving Fentanyl . Unable to take morphine due to allergy. Call to Dr Georgia Dom for pain management. Valium 5mg  po given and On  Q Pump to be increased.

## 2016-12-21 NOTE — Progress Notes (Signed)
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 Walked 3 times. No complaints. Pain controlled.    Physical Exam:     Vitals:    12/21/16 1632   BP: 140/63   Pulse: 67   Resp: 15   Temp: 97.5 F (36.4 C)   SpO2: 99%       Intake and Output Summary (Last 24 hours) at Date Time    Intake/Output Summary (Last 24 hours) at 12/21/16 1742  Last data filed at 12/21/16 1700   Gross per 24 hour   Intake             1000 ml   Output              830 ml   Net              170 ml       Left knee  - Aquacel Ag in place.  No erythema or drainage.  - Calf soft and non-tender.  - Palpable dorsalis pedis and posterior tibial pulse  - Sensation to light touch intact throughout the left foot  - Motor: Intact toe flexion/extension and ankle dorsiflexion/plantarflexion      Labs:     Results     Procedure Component Value Units Date/Time    Type and Screen [[id number]] Collected:  12/21/16 0653    Specimen:  Blood Updated:  12/21/16 0735     ABO Rh O NEG     AB Screen Gel NEG              Signed by: Cathey Endow

## 2016-12-21 NOTE — Anesthesia Procedure Notes (Addendum)
Peripheral    Patient location during procedure: Pre-Op  Reason for block: Post-op pain managment  Injection technique: Catheter  Block Region: Adductor canal/Mid-thigh femoral  Laterality: Left  Block at surgeon's request Yes  Start time: 12/21/2016 7:02 AM  End time: 12/21/2016 7:10 AM    Staffing  Anesthesiologist: Mariah Milling A  Performed: Anesthesiologist     Pre-procedure Checklist   Completed: patient identified, surgical consent, pre-op evaluation, timeout performed, risks and benefits discussed, anesthesia consent given and correct site  Timeout Completed:  12/21/2016 7:02 AM    Peripheral Block  Patient monitoring: Pulse oximetry, EKG, NIBP and Nasal cannula O2  Patient position: Supine  Premedication: Yes and Meaningful contact maintained  Local infiltration: Lidocaine 1%    Needle  Needle type: Tuohy   Needle gauge: 18 G  Needle length: 4 in  Catheter size: 20 G  Catheter at skin depth: 5.5 cm    Procedures: ultrasound guided  Ultrasound Guided: LA spread visualized, Needle visualized, Relevant anatomy identified (nerve, vessels, muscle), Image stored or printed and Catheter visualized      Assessment   Incremental injection: yes  Injection made incrementally with aspirations every 5 mL.  Injection Resistance: no  Paresthesia Pain: No    Blood Aspirated: No  no suspected intravascular injection  Patient tolerated procedure well: Yes  Block Outcome: No complications and Successful block

## 2016-12-21 NOTE — Plan of Care (Signed)
Problem: Knee Surgery  Goal: Hemodynamic Stability  Outcome: Progressing   12/21/16 1501   Goal/Interventions addressed this shift   Hemodynamic stability  Monitor/assess vital signs;Maintain temperature within desired parameters;Monitor SpO2 and treat as needed;Monitor/assess surgical drainage output if drain is present;Monitor/assess lab values and report abnormal values;Monitor intake and output. Notify LIP if urine output is less than 30 ml/hr.     Goal: Pain at adequate level as identified by patient  Outcome: Progressing  schdueled tramadol and tylenol. PACU gave valium   12/21/16 1501   Goal/Interventions addressed this shift   Pain at adequate level as identified by patient  Identify patient comfort function goal;Evaluate if patient comfort function goal is met;Administer analgesics as prescribed to achieve pain goal;Reposition patient every 2 hours and PRN unless able to self-reposition     Goal: Stable Neurovascular Status  Outcome: Progressing  Neurovascularly intact to LLE     12/21/16 1501   Goal/Interventions addressed this shift   Stable neurovascular status  Assess and document plantar/dorsiflexion every 4 hours;Monitor/assess neurovascular status (pulses, capillary refill, pain, paresthesia, presence of edema);Monitor/assess for return of sensation after nerve block therapy if indicated     Goal: Mobility/activity is maintained at optimum level for patient  Outcome: Progressing  Has been out of bed to restroom with immobilizer and walker.    12/21/16 1501   Goal/Interventions addressed this shift   Mobility/activity is maintained at optimum level for patient Administer analgesics as prescribed to achieve pain goal;Utilize special equipment (trapeze, abduction pillow, regular pillow, walker) as needed and as ordered;PT and/or OT evaluation and treatment if ordered;Review weight bearing status with patient/patient care companion;Teach/review/reinforce exercises (ankle pumps, quad sets, gluteal  sets);Teach/review/reinforce knee precautions with patient/patient care companion (no pillow under knee, lock out knee flexion feature on bed)       Comments: Pt post op to left knee. Ace wrap to LLE is clean, dry and intact. Voided but still had of urine in bladder, monitoring. Nerve block @ 8 ml

## 2016-12-21 NOTE — Progress Notes (Signed)
Dr Georgia Dom here seeing pt. labetolol 10 mg iv given for BP

## 2016-12-21 NOTE — Op Note (Signed)
FULL OPERATIVE NOTE    Date Time: 12/21/16 9:38 AM  Patient Name: [name]  Attending Physician: Cathey Endow, MD      Date of Operation:   12/21/2016    Providers Performing:   Surgeon(s):  Cathey Endow, MD  Fitzmorris, Stanley, [place]    Circulator: Lavonna Monarch, RN  Relief Circulator: Doroteo Glassman, RN  Relief Scrub: Phillis Knack  Scrub Person: Chase Caller  First Assistant: Serita Grit    Operative Procedure:   Left total knee arthroplasty    Preoperative Diagnosis:   Left knee osteoarthritis    Postoperative Diagnosis:   Left knee osteoarthritis    Anesthesia   Spinal  Adductor canal block catheter    Estimated Blood Loss:   30 mL    Implants:     Implant Name Type Inv. Item Serial No. Manufacturer Lot No. LRB No. Used Action   CEMENT COBALT HV HI CNTRST 40G - [id number] Cement CEMENT COBALT HV HI CNTRST 40G  [employer] [id number] Left 1 Implanted   CEMENT COBALT HV HI CNTRST 40G - [id number] Cement CEMENT COBALT HV HI CNTRST 40G  [employer] [id number] Left 1 Implanted   BASEPLT TRIATH TIB SZ3 - [id number] Base BASEPLT TRIATH TIB SZ3  [employer] [id number] Left 1 Implanted   COMPONENT FEMORAL KNEE SZ4 - [id number] Component COMPONENT FEMORAL KNEE SZ4  [employer] [id number] Left 1 Implanted   PATELLA ASYMMETRIC A3E - [id number] Joint PATELLA ASYMMETRIC A3E  [employer] [id number] Left 1 Implanted   INSERT TIB  BEARING SZ3 - [id number] Component INSERT TIB  BEARING SZ3   [employer] [id number] Left 1 Implanted       Drains:   None    Specimens:        SPECIMENS (last 24 hours)      Pathology Specimens     Row Name 12/21/16 0805                Additional Information    Send final report to: Dr. Anise Salvo          Specimen Information    Specimen Testing Required Gross Only       Specimen ID  [id number]       Specimen Description Left knee bone and soft tissue           Complications:   Non      Indications:   The patient is a 76 year old female with longstanding history of  pain in the left knee secondary to osteoarthritis.  After prolonged conservative management, the patient was offered a total knee arthroplasty.  The risks and benefits of the procedure were described.  Risks include but are not  limited to, bleeding, infection, neurovascular injury, persistent pain,  stiffness, loosening, wear, fracture, instability, DVT, pulmonary embolism, loss of limb, stroke, heart attack, death, and the risks of anesthesia.  The patient indicated their  understanding and agreed to proceed.    Operative Notes:   The patient was brought to the preoperative holding area and identified and  identified as Kelsey S.  The left knee was marked as the appropriate operative site.  Preoperative antibiotics were administered prior to  surgical incision.  An adductor canal block catheter  was placed by anesthesia with no complications. The patient was then brought to the operating room and placed on the operating room table.  Spinal anesthesia was administered by the anesthesiologist with no complications. A tourniquet was applied to the thigh.  The left lower extremity was prepped and draped in usual standard fashion.  An operative timeout was performed and the operative team unanimously confirmed the patient, procedure and operative site.  At  this time, an Esmarch was used to exsanguinate the extremity and the  tourniquet was inflated to  250 mm Hg.  A midline incision and medial  parapatellar arthrotomy was performed.  The medial capsular sleeve was elevated  to the mid coronal plane.  The patella was subluxed laterally.  The ACL was intact. The PCL was intact.  Inspection of the knee demonstrated advanced valgus osteoarthritis with advanced wear of the lateral femoral condyle and lateral tibial plateau.  The ACL was excised. At this time, the Stryker navigation array was pinned to the distal femur. We then registerred the hip center and distal femur. The distal femoral cutting block was the  attached and adjusted to take a 9 mm resection from the high side in 3 degrees of flexion and neutral mechanical axis. The array was then removed. The distal femoral resection was than made and the cutting block was removed. With the knee flexed,  the femoral sizing guidewas applied to the femur and the femur sized to a size 4.  The size 4, 4-in-1 femoral cutting block was pinned parallel to the transepicondylar axis. The finishing cuts were then made.  The Stryker navigation array was then  pinned to the tibial articular surface and the proximal tibia and medial and lateral malleolus were registered. The tibial resection block was placed and adjusted to make a resection perpendicular to the long axis of the tibia in the frontal plane with 3 degrees of posterior slope, taking 3 mm off the low side, lateral tibial plateau. The array was then removed and the resection was made. The cutting block was then removed.  With the knee in extension, the extension gap was checked with a spacer block and overall alignment was checked with rods.  The medial and lateral meniscus were excised and osteophytes removed from the posterior aspect of the femoral condyles.  Flexion and extension gaps were checked to ensure they were well balanced.  The lateral compartments was noted to be tight.  We then released the posterolateral structures in order to obtain a rectangular extension gap.  The tibia was sized to a size 3. A size 3 tibial base plate was pinned and aligned to the tibial tubercle.  The trial size 4 cruciate retaining femoral component was then placed.  Trial inserts were then placed.  With a 9 mm thick trial insert, there was excellent varus and valgus  stability.  The knee was taken through a range of motion.  There was excellent patellar tracking.  The knee continued to be tight in flexion medially and laterally.  At this time, the decision was made to convert to a posterior stabilized femoral component.  The posterior  cruciate ligament was resected.  The posterior stabilized size 4 box resection guide was placed and pinned in position.  The posterior segment was box resection was then performed.  We then placed a trial size 4 posterior stabilized femoral component and trial size 3 tibial component with a 9 mm thick insert.  The knee continued to be tight posterolaterally and with examination.  There was noted to be a very tight popliteus tendon.  The popliteus tendon was then released in order to obtain a rectangular flexion gap.  We then switched to a 11 mm thick posterior stabilized trial insert and reduced  the knee.  There was excellent range of motion from 0-130 degrees of flexion with excellent patellar tracking.  The knee was well balanced in both flexion and extension.   Attention was turned to the patella.   Osteophytes were removed from the patella.  The patella was cut with an  oscillating saw and sized to size 32-mm asymmetric component.  A 32-mm template was placed. Three peg holes were drilled.  A trial 32-mm asymmetric component was then placed.  The knee was reduced and taken through a range of motion.  There  was excellent patellar tracking.  At this time, the trial patellar component, femoral component and insert were removed.  Tibial rotation was set with a keel punch.  Trial tibial component was removed.  The knee was copiously irrigated with pulse lavage irrigation.  Bone surfaces were dried.  The posterior capsule was injected with 20 mL 0.5% ropivicaine, 0.5 mg epinephrine, and 15 mg of Toradol mixed with 20 mL injectable saline. The final components were cemented into place using bone cement (Stryker Triathalon total knee arthroplasty size 4 PS left femoral component, size 3 tibial component, and a size 32 mm asymmetric patellar component.  Excess cement was removed from the periphery of the components.  A trial insert was placed while the cement cured. After the cement had cured, I again took the knee  through a range of motion with a 11-mm thick insert, and there was excellent varus-valgus stability,  range of motion and patellar tracking.  At this time, the trial insert was  removed.  The tourniquet was deflated and hemostasis was obtained. Copious irrigation was used and the knee was irrigated and suctioned. The final 11-mm thick insert was then placed.  The knee was reduced.  The arthrotomy was closed with #1 Vicryl.  Subcutaneous tissue was closed with 2-0 Vicryl and the skin was closed with 4-0 monocryl subcuticular suture followed by Dermabond.  An Aquacel Ag dressing was then applied. The patient was awoken and transported to the recovery room in stable condition.  In the recovery room, the patient had excellent palpable pedal pulses.     POSTOPERATIVE PLAN:  The patient will be admitted to the orthopedic floor and receive 24 hours  of prophylactic intravenous antibiotics.  DVT prophylaxis will include TED  hose and SCDs, and aspirin will be started on postoperative day #1.              Signed by: Cathey Endow, MD

## 2016-12-21 NOTE — Anesthesia Preprocedure Evaluation (Addendum)
Anesthesia Evaluation    AIRWAY    Mallampati: II    TM distance: >3 FB  Neck ROM: full  Mouth Opening:full   CARDIOVASCULAR    cardiovascular exam normal, regular and normal       DENTAL    no notable dental hx     PULMONARY    pulmonary exam normal and clear to auscultation     OTHER FINDINGS    EKG SR  NL HCT PLT CR    No cp sob          Relevant Problems   No active problems are marked relevant to this note.               Anesthesia Plan    ASA 3     spinal               (Risks discussed including but not limited to:  neurological complications such as stroke,   cardiovascular complications such as heart attack,   pulmonary complications such as asthmatic attack,   intra-operative awareness, death, dental trauma, and allergic reaction.     Questions answered.     Pt understands and wishes to proceed.  L midthigh femoral cath then spinal  Martie Lee, MD)      intravenous induction   Detailed anesthesia plan: spinal        Post op pain management: per surgeon and PNB catheter        Plan discussed with CRNA.    ECG reviewed  pertinent labs reviewed             Signed by: Martie Lee 12/21/16 6:49 AM

## 2016-12-21 NOTE — Brief Op Note (Signed)
BRIEF OP NOTE    Date Time: 12/21/16 8:56 AM    Patient Name:   [name]    Date of Operation:   12/21/2016    Providers Performing:   Surgeon(s):  Cathey Endow, MD  Fitzmorris, Alycia Rossetti, [place]    Assistant (s):   Circulator: Lavonna Monarch, RN  Relief Circulator: Doroteo Glassman, RN  Scrub Person: Chase Caller  First Assistant: Serita Grit    Operative Procedure:   Left total knee arthroplasty     Preoperative Diagnosis:   Left knee osteoarthritis    Postoperative Diagnosis:   Left knee osteoarthritis    Anesthesia:   Spinal   Adductor canal block catheter    Estimated Blood Loss:    30 mL    Implants:     Implant Name Type Inv. Item Serial No. Manufacturer Lot No. LRB No. Used Action   CEMENT COBALT HV HI CNTRST 40G - [id number] Cement CEMENT COBALT HV HI CNTRST 40G  [employer] [id number] Left 1 Implanted   CEMENT COBALT HV HI CNTRST 40G - [id number] Cement CEMENT COBALT HV HI CNTRST 40G  [employer] [id number] Left 1 Implanted   BASEPLT TRIATH TIB SZ3 - [id number] Base BASEPLT TRIATH TIB SZ3  [employer] [id number] Left 1 Implanted   COMPONENT FEMORAL KNEE SZ4 - [id number] Component COMPONENT FEMORAL KNEE SZ4  [employer] [id number] Left 1 Implanted   PATELLA ASYMMETRIC A3E - [id number] Joint PATELLA ASYMMETRIC A3E  [employer] [id number] Left 1 Implanted   INSERT TIB  BEARING SZ3 - [id number] Component INSERT TIB  BEARING SZ3   [employer] [id number] Left 1 Implanted       Drains:   Drains: no    Specimens:        SPECIMENS (last 24 hours)      Pathology Specimens     Row Name 12/21/16 0805                Additional Information    Send final report to: Dr. Anise Salvo          Specimen Information    Specimen Testing Required Gross Only       Specimen ID  [id number]       Specimen Description Left knee bone and soft tissue           Findings:   Advanced osteoarthritis    Complications:   None      Signed by: Cathey Endow, MD                                                                            [facility]

## 2016-12-21 NOTE — Consults (Signed)
[name]  MEDICINE CONSULT NOTE      Patient: [name]  Date: 12/21/2016   DOB: [birth year]  Admission Date: 12/21/2016   MRN: [id number]  Attending: Cathey Endow, MD       Reason for Consult: medical management   Requesting Physician: Cathey Endow, MD  Consulting Physician:Parul Porcelli Leonides Sake, MD  History Gathered From: patient   She was admitted for left TKA for left knee osteoarthritis      HISTORY AND PHYSICAL     [name] is a 76 y.o. female with a PMHx of left breast cancer s/p lumpectomy and Radiating treatment , was on anastrozole for 5 years  who was admitted for Left TKA  For left knee osteoarthritis . She just came to the floor from PACU . She is complaining of pain on left knee about 8/10 intensity . Her BP has elevated in PACU- BP upto 225/106. She was having severe knee pain then and received labetalol which improved the BP . She states she has white coat HTN . She had elevated BP when she was in hospital 5 years ago for breast surgery but has never been on antihypertensive .   She denies chest pain, sob, nausea. vomiting , dizziness       Past Medical History:   Diagnosis Date   . Arthritis    . Asthma    . Bilateral cataracts     removed both   . Breast cancer     left lumpectomy with radiation tx   . Difficulty walking    . Gastroesophageal reflux disease    . Hypertensive disorder     not on med,   . Seasonal allergies        Past Surgical History:   Procedure Laterality Date   . ABCESS DRAINAGE      from buttock   . BREAST SURGERY Left 01/2011    well diff CA 0.8 cm Grade 1 (T1) N0MX  ER +   . D&C WITH HYSTEROSCOPY     . EYE SURGERY Left     cataract   . HYSTERECTOMY         Prior to Admission medications    Medication Sig Start Date End Date Taking? Authorizing Provider   acetaminophen (TYLENOL) 500 MG tablet Take 500 mg by mouth.   Yes [provider]   aspirin 81 MG tablet Take 81 mg by mouth daily.     Yes [provider]   Calcium  Carbonate-Vitamin D (CALTRATE 600+D PO) Take by mouth 2 (two) times daily.       Yes [provider]   dextromethorphan-guaiFENesin (MUCINEX DM) 30-600 MG per 12 hr tablet Take 1 tablet by mouth as needed.   Yes [provider]   GLUCOSAMINE-CHONDROITIN PO Take 1,500 mg by mouth 2 (two) times daily.       Yes [provider]   ibuprofen (ADVIL,MOTRIN) 200 MG tablet Take 200 mg by mouth every 6 (six) hours as needed.     Yes [provider]   loratadine (CLARITIN) 10 MG tablet Take 10 mg by mouth daily.     Yes [provider]   montelukast (SINGULAIR) 10 MG tablet Take 10 mg by mouth nightly.   Yes [provider]   ranitidine (ZANTAC) 150 MG tablet Take 150 mg by mouth daily.       Yes [provider]   saccharomyces boulardii (FLORASTOR) 250 MG capsule Take  250 mg by mouth daily.   Yes [provider]       Allergies   Allergen Reactions   . Erythromycin    . Morphine    . Penicillins    . Tetracycline    . Levofloxacin    . Naprosyn [Naproxen]    . Sulfa Antibiotics        CODE STATUS: full code    PRIMARY CARE MD: Bluford Main, MD    Family History   Problem Relation Age of Onset   . Cancer Other 73     prostate   . Colon cancer Mother 73     Deceased   . Cancer Father        Social History   Substance Use Topics   . Smoking status: Never Smoker   . Smokeless tobacco: Never Used   . Alcohol use No   alcohol use- occasionally   Retired nurse     REVIEW OF SYSTEMS     Ten point review of systems negative or as per HPI and below endorsements.    PHYSICAL EXAM     Vital Signs (most recent): BP 147/65   Pulse 63   Temp 97.6 F (36.4 C) (Oral)   Resp 12   Ht 1.651 m (5\' 5" )   Wt 59.7 kg (131 lb 11.2 oz)   SpO2 100%   BMI 21.92 kg/m   Constiutional: No apparent distress.  Patient speaks freely in full sentences.   HEENT: PERRL, no scleral icterus or conjunctival pallor, no nasal discharge, MMM, oropharynx without erythema or  exudate  Neck: trachea midline, supple, no cervical or supraclavicular lymphadenopathy or masses  Cardiovascular: RRR, normal S1 S2, no murmurs, no JVD  Respiratory: Normal rate.  Clear to auscultation and percussion bilaterally.  Gastrointestinal: +BS, non-distended, soft, non-tender, no rebound or guarding, no hepatosplenomegaly  Musculoskeletal: left knee dressing intact    No clubbing, edema, or cyanosis. pulses 2+ and symmetric. Normal capillary refill  Skin: no rashes, jaundice or other lesions  Neurologic: EOMI, CN 2-12 grossly intact. no gross motor or sensory deficits,   Psychiatric: affect and mood appropriate.AA0*3    LABS & IMAGING     Recent Results (from the past 24 hour(s))   Type and Screen    Collection Time: 12/21/16  6:53 AM   Result Value Ref Range    ABO Rh O NEG     AB Screen Gel NEG        IMAGING:  XR Knee 1 Or 2 Views Left   Final Result    Status post left total knee arthroplasty.      Blair Promise, MD    12/21/2016 10:00 AM      US GUIDED NERVE BLOCK FOR ANESTHESIA   Final Result          ASSESSMENT & PLAN     [name] is a 76 y.o. female admitted with for Left TKA for left knee osteoarthritis     1. Left knee osteoarthritis -  Underwent Left total knee arthroplasty today   On tramadol , oxycodone and dilaudid prn   - asa 325 mg BID for Dvt ppx starting tomorrow   Follow up H/H in am   Use incentive spirometry   Pt/OT eval and treatment   Continue with bowel regimen       2. Elevated BP   Likely exacerbated by pain   Denies H/O HTN   Monitor BP closely  Started on hydralazine prn for BP control     3. H/O GERD   Resume ranitidine     4. H/O asthma , seasonal allergies   No complaints of shortness of breath or cough   Continue with zyrtec and singulair     5. H/o left breast cancer s/p lumpectomy and radiation treatment   Was on anastrozole for 5 years   Follow up with  mammogram which is due in April   Follow up with primary oncology     Thank for for the opportunity to be  involved in Phoenix S Marchesi's care. We will continue to follow.    Signed,  Cindee Lame, MD  12/21/2016 12:45 PM

## 2016-12-21 NOTE — H&P (Signed)
Seen and examined. H and P reviewed. No interval changes.    The patient is a 76 year old female with a history of   left knee osteoarthritis.  Pain has gotten progressively worse despite conservative measures. Pain is rated 7/10. Functional limitations include standing/walking/stairs.  Safety concerns include increased fall risk.    The patient has taken Tylenol for pain for pain.  They have tried exercises in the form of PT for months. The patient has failed use of assistive devices including cane. The patient has also tried activity modification with no significant long term improvement.    PE:    Left knee    - Alignment normal  - trace effusion  - Skin intact  - ROM decreased with flexion to 100 degrees  - Tenderness on palpation of the medial and lateral joint line.  - Crepitation with ROM  - NVI distally    - Antalgic gait    X-rays left knee: Joint space narrowing, subchondral sclerosis, osteophyte formation.    Impression: Left knee osteoarthritis    Plan:  The patient has tried extensive conservative management and continues to have progressively worsening pain.  The have elected to proceed with   left total knee arthroplasty. Further conservative treatment would not be expected to significantly improve symptoms.    Risks, benefits, and alternatives have been reviewed. The risks include, but are not limited to, bleeding, infection, neurovascular injury, persistent or worsening pain, instability, stiffness, fracture, loosening, wear, loss of limb, DVT, PE, and death. Benefits include potentially improved pain and function. Alternatives include rest, activity modification, PT, medication, injections.  The patient has indicated their understanding and agrees to proceed. Informed consent has been obtained and placed on the chart.

## 2016-12-21 NOTE — Anesthesia Procedure Notes (Addendum)
Spinal      Patient location during procedure: OR  Reason for block: Primary Anesthesia In the OR      Start time: 12/21/2016 7:25 AM    End time: 12/21/2016 7:28 AM    Staffing  Anesthesiologist: Mariah Milling A  Resident/CRNA: Gloris Ham  R  Performed: resident/CRNA       Pre-procedure Checklist   Completed: patient identified, surgical consent, pre-op evaluation, timeout performed, risks and benefits discussed, monitors and equipment checked, anesthesia consent given and correct site  Timeout Completed:  12/21/2016 7:25 AM    Spinal  Patient monitoring: pulse oximetry, NIBP and nasal cannula O2    Premedication: Meaningful Contact Maintained and No    Patient position: sitting    Sterile Technique: povidone-iodine 7.5% surgical scrub  Skin Local: lidocaine 1%    Successful attempt  Interspace: L1-2    Approach: midline  Number of attempts: 1      Needle Placement  Needle type: Pencan   Needle gauge: 25              Paresthesia Pain: no    Catheter Placement   CSF Return: Yes  Blood Return: No              Assessment   Sensory level: T12  Block Outcome: patient tolerated procedure well, no complications and successful block

## 2016-12-21 NOTE — Progress Notes (Signed)
Block procedure completed with sedation. Time out performed with anesthesiologist. Patient tolerated the procedure with no adverse side effects. Procedure discussed prior to start. Peripheral nerve block education initiated, including fall precautions/sensory deficits and pain management.  Patient and responsible adult verbalized understanding.

## 2016-12-21 NOTE — Progress Notes (Signed)
Pt states her knee pain is still 8/10 but she is able to fall asleep and states she feels more relaxed

## 2016-12-21 NOTE — PT Eval Note (Signed)
[facility]  Physical Therapy Evaluation    Patient: TITIANA SEVERA MRN: [id number]   Unit: 5NEW ORTHOPEDICS    Bed: V409/W119-14    Medicare ID # - Medicare Sub. Num: [id number]       SUMMARY:  Pt lives alone but her sister will stay with her for two weeks. Pt would benefit from out patient PT services to promote functional mobility and independence and facilitate safety and quality of life      Recommendation  Discharge Recommendation: Home with supervision;Home with outpatient PT  DME Recommended for Discharge: Front wheel walker  PT - Next Visit Recommendation: 12/22/16    Recommended mode of transportation at discharge:  car    PMP - Progressive Mobility Protocol   PMP Activity: Step 7 - Walks out of Room  Distance Walked (ft) (Step 6,7): 50 Feet      Plan:             Risks/Benefits/POC Discussed with Pt/Family: With patient       Treatment/Interventions: Exercise;Gait training;Stair training;Functional transfer training;Endurance training;Patient/family training;Equipment eval/education;Continued evaluation         Goals  Pt Will Roll Left: independent  Pt Will Go Supine To Sit: independent  Pt Will Perform Sit To Supine: independent  Pt Will Perform Sit to Stand: independent  Pt Will Transfer Bed/Chair: with rolling walker;independent  Pt Will Ambulate: 101-150 feet;with rolling walker;independent  Pt Will Go Up / Down Stairs: 3-5 stairs;with stand by assist;Without rail  Pt Will Perform Home Exer Program: with minimal assist           Interdisciplinary Communication:   Pt left up in bed side chair with family members present and  with alarm activated.  Updated white communication board in room with patient's current mobility status walk x 1 with walker and belt Communicated with attending nurse regarding PT out come.      Education:   Educated patient and her family members to role of physical therapy, plan of care, transfer training techniques, falls prevention, gait training techniques with  R/W, home safety, next appropriate level of care    Patient  demonstrated good understanding.      Discussed goals of therapy with patient and was in agreement with plan.      Evaluation:   Consult received for Tylene Fantasia Hopple for PT evaluation and treatment.  Chart reviewed.  Patient's medical condition is appropriate for Physical Therapy intervention at this time.     Medical Diagnosis: Arthritis of knee, left [M17.12]    Therapy Diagnosis: difficulty walking, decreased ROM and muscle strength    Precautions  Weight Bearing Status: no restrictions  Other Precautions:  (risk of fall)    History of Present Illness: Kamaljit Hizer Terlecki is a 76 y.o. female admitted on 12/21/2016 and underwent left TKR    Patient Active Problem List   Diagnosis   . Breast CA   . Hx of breast cancer   . Primary osteoarthritis of one knee, left     Past Medical History:   Diagnosis Date   . Arthritis    . Asthma    . Bilateral cataracts     removed both   . Breast cancer     left lumpectomy with radiation tx   . Difficulty walking    . Gastroesophageal reflux disease    . Hypertensive disorder     not on med,   . Seasonal allergies      Past Surgical  History:   Procedure Laterality Date   . ABCESS DRAINAGE      from buttock   . BREAST SURGERY Left 01/2011    well diff CA 0.8 cm Grade 1 (T1) N0MX  ER +   . D&C WITH HYSTEROSCOPY     . EYE SURGERY Left     cataract   . HYSTERECTOMY             Prior Level of Function  Prior level of function: Ambulates independently  Baseline Activity Level: Community ambulation  Driving: independent  Employment: Retired      Cabin crew Arrangements: Alone  Type of Home: [employer]  Home Layout: Two level  Bathroom Shower/Tub: Pension scheme manager: Standard  Bathroom Accessibility: Accessible        Subjective: Patient is agreeable to participation in the therapy session. Nursing clears patient for therapy.   Pain Assessment  Pain Assessment:  (6/10 left knee with movement)           Objective:  Patient is in bed with peripheral IV, O2 at 3 liters/minute via nasal cannula in place.           Cognition/Neuro Status  Arousal/Alertness:  (all faculties WQFL)  Behavior: cooperative        Musculoskeletal Examination  Gross ROM  Right Lower Extremity ROM: within functional limits  Left Lower Extremity ROM:  (decreased knee flexion, rest WFL)                                                          Gross Strength  Right Lower Extremity Strength: within functional limits  Left Lower Extremity Strength:  (Active SLR, dorsi flex:5/5.)                Sensation: intact BLEs      Functional Mobility  Rolling: Independent  Supine to Sit: Supervision  Scooting to EOB: Supervision  Sit to Stand: Contact Guard Assist      Transfers  Bed to Chair:  (with r/W and CG assist)  Device Used for Functional Transfer: front-wheeled walker       AM-PAC Basic Mobility  Turning Over in Bed: None  Sitting Down On/Standing From Armchair: A little  Lying on Back to Sitting on Side of Bed: A little  Assist Moving to/from Bed to Chair: A little  Assist to Walk in Hospital Room: A little  Assist to Climb 3-5 Steps with Railing: Total  PT Basic Mobility Raw Score: 17    Locomotion  Ambulation:  (77' with R.W and CG assist)  Pattern:  (uneven reciprocal steps, poor posture)  Stair Management:  (N/A at this time)       Participation and Endurance  Participation Effort: excellent  Endurance:  (poor)           Treatment:  PT eval    Assessment:   Examination - Assessment: Decreased LE ROM;Decreased LE strength;Decreased endurance/activity tolerance;Impaired motor control;Decreased functional mobility;Gait impairment, resulting in difficulty with ind mobility skills.   Standardized tests and exams incorporated into evaluation include AMPAC mobility, ROM  and Strength.    Co-morbidities/Patient factors affecting plan of care -  There are no comorbidities or other factors that affect plan of care and require modification of task  including: has stairs to  manage and lives alone.    Clinical factors affecting plan of care -  Pt demonstrates a evolving clinical presentation due to surgery.     Prognosis: Good;With continued PT status post acute discharge          G codes  Based on AM-PAC Current: Mobility, Current Status (W0981): At least 40 percent but less than 60 percent impaired, limited or restricted       Goal: Mobility, Goal Status (X9147): At least 20 percent but less than 40 percent impaired, limited or restricted       Discharge:     MD Co-sign         Time Calculation  PT Received On: 12/21/16  Start Time: 1450  Stop Time: 1550  Time Calculation (min): 60 min          Signature:  Barbaraann Boys, PT

## 2016-12-21 NOTE — Anesthesia Postprocedure Evaluation (Signed)
Anesthesia Post Evaluation    Patient: [name]    Procedures performed: Procedure(s):  ARTHROPLASTY, KNEE, TOTAL, COMPUTER NAVIGATION    Anesthesia type: Spinal    Patient location:PACU    Last vitals:   Vitals:    12/21/16 1138   BP: 147/65   Pulse: 63   Resp: 12   Temp: 36.4 C (97.6 F)   SpO2: 100%       Post pain: Patient not complaining of pain, continue current therapy , pain improved after bolus through onQ catheter     Mental Status:awake    Respiratory Function: tolerating nasal cannula    Cardiovascular: stable    Nausea/Vomiting: patient not complaining of nausea or vomiting    Hydration Status: adequate    Post assessment: no apparent anesthetic complications, no reportable events and no evidence of recall    Signed by: Martie Lee, 12/21/2016 11:45 AM

## 2016-12-21 NOTE — Transfer of Care (Signed)
Anesthesia Transfer of Care Note    Patient: [name]    Procedures performed: Procedure(s):  ARTHROPLASTY, KNEE, TOTAL, COMPUTER NAVIGATION    Anesthesia type: Spinal    Patient location:Phase I PACU    Last vitals:   Vitals:    12/21/16 0641   BP: 187/86   Pulse: 80   Resp: 20   Temp: 36.7 C (98.1 F)   SpO2: 97%       Post pain: Continue adjustment of pain medication; per SOP     Mental Status:awake    Respiratory Function: tolerating nasal cannula    Cardiovascular: stable    Nausea/Vomiting: patient not complaining of nausea or vomiting    Hydration Status: adequate    Post assessment: no apparent anesthetic complications    Signed by: Particia Jasper  12/21/16 9:26 AM

## 2016-12-21 NOTE — H&P (Signed)
PRE-SURGICAL HISTORY AND PHYSICAL EXAM    Date Time: 12/21/16 7:12 AM  Patient Name: [name]  Attending Physician: Cathey Endow, MD    Assessment:   Left knee osteoarthritis    Plan:   Left TKA    History of Present Illness:   [name] is a 76 y.o. female who presents to the hospital for total knee arthroplasty due to history of prolonged knee pain. Patient has failed a response to therapeutic exercise, intraarticular cortisone injection, and use of NSAID's. Patient's ADL's are negatively impacted including inability to walk short distances without severe pain.    Past Medical History:     Past Medical History:   Diagnosis Date   . Arthritis    . Asthma    . Bilateral cataracts     removed both   . Breast cancer     left lumpectomy with radiation tx   . Difficulty walking    . Gastroesophageal reflux disease    . Hypertensive disorder     not on med,   . Seasonal allergies        Past Surgical History:     Past Surgical History:   Procedure Laterality Date   . ABCESS DRAINAGE      from buttock   . BREAST SURGERY Left 01/2011    well diff CA 0.8 cm Grade 1 (T1) N0MX  ER +   . D&C WITH HYSTEROSCOPY     . EYE SURGERY Left     cataract   . HYSTERECTOMY         Family History:     Family History   Problem Relation Age of Onset   . Cancer Other 73     prostate   . Colon cancer Mother 4     Deceased   . Cancer Father        Social History:     Social History     Social History   . Marital status: Widowed     Spouse name: N/A   . Number of children: N/A   . Years of education: N/A     Social History Main Topics   . Smoking status: Never Smoker   . Smokeless tobacco: Never Used   . Alcohol use No   . Drug use: No   . Sexual activity: Not on file     Other Topics Concern   . Not on file     Social History Narrative   . No narrative on file       Allergies:     Allergies   Allergen Reactions   . Erythromycin    . Morphine    . Penicillins    . Tetracycline    . Levofloxacin    . Naprosyn [Naproxen]    .  Sulfa Antibiotics        Medications:     Prescriptions Prior to Admission   Medication Sig   . acetaminophen (TYLENOL) 500 MG tablet Take 500 mg by mouth.   Marland Kitchen aspirin 81 MG tablet Take 81 mg by mouth daily.     . Calcium Carbonate-Vitamin D (CALTRATE 600+D PO) Take by mouth 2 (two) times daily.       Marland Kitchen dextromethorphan-guaiFENesin (MUCINEX DM) 30-600 MG per 12 hr tablet Take 1 tablet by mouth as needed.   Marland Kitchen GLUCOSAMINE-CHONDROITIN PO Take 1,500 mg by mouth 2 (two) times daily.       Marland Kitchen ibuprofen (ADVIL,MOTRIN) 200 MG tablet Take  200 mg by mouth every 6 (six) hours as needed.     . loratadine (CLARITIN) 10 MG tablet Take 10 mg by mouth daily.     . montelukast (SINGULAIR) 10 MG tablet Take 10 mg by mouth nightly.   . ranitidine (ZANTAC) 150 MG tablet Take 150 mg by mouth daily.       Marland Kitchen saccharomyces boulardii (FLORASTOR) 250 MG capsule Take 250 mg by mouth daily.           Physical Exam:     Vitals:    12/21/16 0641   BP: 187/86   Pulse: 80   Resp: 20   Temp: 98.1 F (36.7 C)   SpO2: 97%     AAOX3 NAD pleasant  Skin grossly intact w/o abrasions or lesions  Cardiac RRR no murmurs  Pulmonary CTA B/L equal breath sounds  Neuro no focal deficit/moves all extremities equally  Limited ROM and joint line tenderness to affected knee              Signed by: Alycia Rossetti Bralen Wiltgen

## 2016-12-22 ENCOUNTER — Inpatient Hospital Stay: Payer: Medicare Other

## 2016-12-22 ENCOUNTER — Encounter: Payer: Self-pay | Admitting: Orthopaedic Surgery

## 2016-12-22 LAB — CBC AND DIFFERENTIAL
Absolute NRBC: 0 10*3/uL
Basophils Absolute Automated: 0.02 10*3/uL (ref 0.00–0.20)
Basophils Automated: 0.3 %
Eosinophils Absolute Automated: 0.11 10*3/uL (ref 0.00–0.70)
Eosinophils Automated: 1.7 %
Hematocrit: 35.2 % — ABNORMAL LOW (ref 37.0–47.0)
Hgb: 11.1 g/dL — ABNORMAL LOW (ref 12.0–16.0)
Immature Granulocytes Absolute: 0.03 10*3/uL
Immature Granulocytes: 0.5 %
Lymphocytes Absolute Automated: 0.43 10*3/uL — ABNORMAL LOW (ref 0.50–4.40)
Lymphocytes Automated: 6.8 %
MCH: 28.5 pg (ref 28.0–32.0)
MCHC: 31.5 g/dL — ABNORMAL LOW (ref 32.0–36.0)
MCV: 90.3 fL (ref 80.0–100.0)
MPV: 11.3 fL (ref 9.4–12.3)
Monocytes Absolute Automated: 0.54 10*3/uL (ref 0.00–1.20)
Monocytes: 8.5 %
Neutrophils Absolute: 5.22 10*3/uL (ref 1.80–8.10)
Neutrophils: 82.2 %
Nucleated RBC: 0 /100 WBC (ref 0.0–1.0)
Platelets: 129 10*3/uL — ABNORMAL LOW (ref 140–400)
RBC: 3.9 10*6/uL — ABNORMAL LOW (ref 4.20–5.40)
RDW: 14 % (ref 12–15)
WBC: 6.35 10*3/uL (ref 3.50–10.80)

## 2016-12-22 LAB — BASIC METABOLIC PANEL
Anion Gap: 6 (ref 5.0–15.0)
BUN: 12 mg/dL (ref 7–19)
CO2: 25 mEq/L (ref 22–29)
Calcium: 8.3 mg/dL (ref 7.9–10.2)
Chloride: 104 mEq/L (ref 100–111)
Creatinine: 0.6 mg/dL (ref 0.6–1.0)
Glucose: 145 mg/dL — ABNORMAL HIGH (ref 70–100)
Potassium: 4.3 mEq/L (ref 3.5–5.1)
Sodium: 135 mEq/L — ABNORMAL LOW (ref 136–145)

## 2016-12-22 LAB — LAB USE ONLY - HISTORICAL SURGICAL PATHOLOGY

## 2016-12-22 LAB — GFR: EGFR: >60

## 2016-12-22 MED ORDER — OXYCODONE HCL 5 MG PO TABS
5.0000 mg | ORAL_TABLET | ORAL | Status: DC | PRN
Start: 2016-12-22 — End: 2016-12-24
  Administered 2016-12-23 (×2): 5 mg via ORAL
  Filled 2016-12-22: qty 1

## 2016-12-22 MED ORDER — OXYCODONE HCL 5 MG PO TABS
10.0000 mg | ORAL_TABLET | ORAL | Status: DC | PRN
Start: 2016-12-22 — End: 2016-12-24
  Administered 2016-12-22 – 2016-12-24 (×8): 10 mg via ORAL
  Filled 2016-12-22 (×9): qty 2

## 2016-12-22 MED ORDER — ACETAMINOPHEN 500 MG PO TABS
1000.0000 mg | ORAL_TABLET | Freq: Three times a day (TID) | ORAL | Status: DC
Start: 2016-12-22 — End: 2016-12-24
  Administered 2016-12-22 – 2016-12-24 (×6): 1000 mg via ORAL
  Filled 2016-12-22 (×6): qty 2

## 2016-12-22 MED ORDER — GABAPENTIN 300 MG PO CAPS
300.0000 mg | ORAL_CAPSULE | Freq: Three times a day (TID) | ORAL | Status: DC
Start: 2016-12-22 — End: 2016-12-24
  Administered 2016-12-22 – 2016-12-24 (×5): 300 mg via ORAL
  Filled 2016-12-22 (×5): qty 1

## 2016-12-22 MED ORDER — HYDROCODONE-ACETAMINOPHEN 5-325 MG PO TABS
1.0000 | ORAL_TABLET | Freq: Four times a day (QID) | ORAL | Status: DC | PRN
Start: 2016-12-22 — End: 2016-12-22

## 2016-12-22 MED ORDER — HYDROCODONE-ACETAMINOPHEN 10-325 MG PO TABS
1.0000 | ORAL_TABLET | Freq: Four times a day (QID) | ORAL | Status: DC | PRN
Start: 2016-12-22 — End: 2016-12-22
  Administered 2016-12-22: 1 via ORAL
  Filled 2016-12-22: qty 1

## 2016-12-22 MED ORDER — ONDANSETRON 4 MG PO TBDP
4.0000 mg | ORAL_TABLET | Freq: Four times a day (QID) | ORAL | Status: DC | PRN
Start: 2016-12-22 — End: 2016-12-24

## 2016-12-22 MED ORDER — ONDANSETRON HCL 4 MG/2ML IJ SOLN
4.0000 mg | Freq: Four times a day (QID) | INTRAMUSCULAR | Status: DC | PRN
Start: 2016-12-22 — End: 2016-12-24
  Administered 2016-12-22: 16:00:00 4 mg via INTRAVENOUS
  Filled 2016-12-22: qty 2

## 2016-12-22 NOTE — Progress Notes (Signed)
PROGRESS NOTE    Date Time: 12/22/16 8:13 AM  Patient Name: Micheli,Jamilett S      Assessment:   1 Day Post-Op status post left total knee arthroplasty    Plan:   Physical therapy  DVT prophylaxis: TEDs, foot pumps, aspirin  Change pain meds to Norco  Monitor nausea  Continue PT  D/C planning: Anticipate D/C home tomorrow. Requires additional PT for safe D/C home.    Subjective:   Having some nausea over night. Knee with a little more pain this morning. Has been trying to avoid pain meds.     Physical Exam:     Vitals:    12/22/16 0731   BP: 142/63   Pulse: 75   Resp: 16   Temp: (!) 96.2 F (35.7 C)   SpO2: 98%       Intake and Output Summary (Last 24 hours) at Date Time    Intake/Output Summary (Last 24 hours) at 12/22/16 0813  Last data filed at 12/22/16 0655   Gross per 24 hour   Intake             1000 ml   Output             1980 ml   Net             -980 ml       Left knee  - Aquacel Ag in place.  No erythema or drainage.  - Calf soft and non-tender.  - Palpable dorsalis pedis and posterior tibial pulse  - Sensation to light touch intact throughout the left foot  - Motor: Intact toe flexion/extension and ankle dorsiflexion/plantarflexion      Labs:     Results     Procedure Component Value Units Date/Time    Basic Metabolic Panel [[id number]]  (Abnormal) Collected:  12/22/16 0506    Specimen:  Blood Updated:  12/22/16 0623     Glucose 145 (H) mg/dL      BUN 12 mg/dL      Creatinine 0.6 mg/dL      Calcium 8.3 mg/dL      Sodium 409 (L) mEq/L      Potassium 4.3 mEq/L      Chloride 104 mEq/L      CO2 25 mEq/L      Anion Gap 6.0    GFR [[id number]] Collected:  12/22/16 0506     Updated:  12/22/16 0623     EGFR >60.0    CBC and differential [[id number]]  (Abnormal) Collected:  12/22/16 0506    Specimen:  Blood from Blood Updated:  12/22/16 0623     WBC 6.35 x10 3/uL      Hgb 11.1 (L) g/dL      Hematocrit 57.8 (L) %      Platelets 129 (L) x10 3/uL      RBC 3.90 (L) x10 6/uL      MCV 90.3 fL      MCH 28.5 pg      MCHC  31.5 (L) g/dL      RDW 14 %      MPV 11.3 fL      Neutrophils 82.2 %      Lymphocytes Automated 6.8 %      Monocytes 8.5 %      Eosinophils Automated 1.7 %      Basophils Automated 0.3 %      Immature Granulocyte 0.5 %      Nucleated RBC 0.0 /100 WBC  Neutrophils Absolute 5.22 x10 3/uL      Abs Lymph Automated 0.43 (L) x10 3/uL      Abs Mono Automated 0.54 x10 3/uL      Abs Eos Automated 0.11 x10 3/uL      Absolute Baso Automated 0.02 x10 3/uL      Absolute Immature Granulocyte 0.03 x10 3/uL      Absolute NRBC 0.00 x10 3/uL               Signed by: Cathey Endow

## 2016-12-22 NOTE — UM Notes (Signed)
NAME:  Szczepanik,Javanna S  DOB:  [birth year]       PMH:    Past Medical History:   Diagnosis Date   . Arthritis    . Asthma    . Bilateral cataracts     removed both   . Breast cancer     left lumpectomy with radiation tx   . Difficulty walking    . Gastroesophageal reflux disease    . Hypertensive disorder     not on med,   . Seasonal allergies           ELECTIVE PROCEDURE DONE 12/21/16:    ARTHROPLASTY, KNEE, TOTAL, COMPUTER NAVIGATION    Anesthesia Plan  ASA 3   spinal       12/21/16 0711  Place in Outpatient/Ambulatory Status        Admitted to surgical unit post-op.    Current Facility-Administered Medications   Medication Dose Route Frequency   . acetaminophen  1,000 mg Oral Q8H SCH   . aspirin EC  325 mg Oral BID   . cetirizine  10 mg Oral Daily   . ferrous sulfate  324 mg Oral QAM W/BREAKFAST   . montelukast  10 mg Oral QHS   . senna-docusate  2 tablet Oral BID   . traMADol  50 mg Oral 4 times per day   . vitamin C  500 mg Oral Daily   . vitamins/minerals  1 tablet Oral Daily     . sodium chloride 75 mL/hr at 12/22/16 0231   . ropivacaine 8 mL/hr at 12/21/16 1191          --------------------12/22/16--------------------  Vitals:    12/22/16 1218   BP: 151/76   Pulse: 85   Resp: 16   Temp: (!) 96.9 F (36.1 C)   SpO2: 94%     Plan:   Physical therapy  DVT prophylaxis: TEDs, foot pumps, aspirin  Change pain meds to Norco  Monitor nausea  Continue PT  D/C planning: Anticipate D/C home tomorrow. Requires additional PT for safe D/C home.    In addition:   Cpt code(s) for planned procedure is (are) NOT on 2018 Medicare Inpatient only procedure list.  However, this patient meets [facility] Guidelines criteria for Inpatient admission per Ambulatory Surgery Exception Criteria: [place] surgery (eg, vascular, abdominal, thoracic, orthopedic) in patient with high anesthetic risk: American Society of Anesthesiologists risk class 3 or higher (severe systemic disease impairing.)  Pt is an ASA class 3 per Anesthesia.          Not stable for discharge after one night in Observation, meets Amb Exception Criteria for Inpatient admission; changed to Inpatient.     12/22/16 0815  Admit to Inpatient             _____________________________________________      ** This clinical review is compiled from documentation provided by the treatment team in the medical record. **    _____________________________________________    Jenell Milliner, RN, BSN  Utilization Review Case Manager  Case Management Department  [facility]  [address]  [place], [place] [address]  Phone:  [phone]  Fax:  [phone]  Case Management Main Phone:  [phone]    [facility] Tax ID:  [id number]  NPI:  [id number]    Please use fax number [phone] to provide authorization for hospital services or to request additional information.  ------------------------------------------------------------------------

## 2016-12-22 NOTE — Progress Notes (Signed)
[name]  HOSPITALIST  PROGRESS NOTE      Patient: [name]  Date: 12/22/2016   LOS: 0 Days  Admission Date: 12/21/2016   MRN: [id number]  Attending: Dr. Dolores Frame     ASSESSMENT/PLAN     [name] is a 76 y.o. female Hx breast Ca admitted with s/p left knee arthroplasty POD # 1      1. osteoarthritis s/p left knee arthroplasty by Dr. Anise Salvo  - some nausea no vomiting  - cont oxycodone, tramadol prn  - PT OT  - ASA BID  2. Seasonal allergies- cont zyrtec    Analgesia: Percocet oral    Nutrition: Regular Diet      Safety Checklist  DVT prophylaxis: Chemical   Foley: Not present   IVs:  None   PT/OT: Ordered   Daily CBC & or Chem ordered: Yes, due to clinical and lab instability       Patient Lines/Drains/Airways Status    Active PICC Line / CVC Line / PIV Line / Drain / Airway / Intraosseous Line / Epidural Line / ART Line / Line / Wound / Pressure Ulcer / NG/OG Tube     Name:   Placement date:   Placement time:   Site:   Days:    CPNB Catheter 12/21/16 Adductor canal/Mid-thigh femoral Left  12/21/16    4010    Adductor canal/Mid-thigh femoral    1    Peripheral IV 12/21/16 Right Hand  12/21/16    0648    Hand    1    Incision Site 12/21/16 Knee Left  12/21/16    0828      1                MD/RN rounds: yes       Code Status: full code    DISPO: home with family    Family Contact: refer to chart  Care Plan discussed with nursing, consultants, case manager.       SUBJECTIVE     [name] states had some nausea. Ate breakfast this AM. Increased pain with PT.    MEDICATIONS     Current Facility-Administered Medications   Medication Dose Route Frequency   . acetaminophen  1,000 mg Oral Q8H SCH   . aspirin EC  325 mg Oral BID   . cetirizine  10 mg Oral Daily   . ferrous sulfate  324 mg Oral QAM W/BREAKFAST   . montelukast  10 mg Oral QHS   . senna-docusate  2 tablet Oral BID   . traMADol  50 mg Oral 4 times per day   . vitamin C  500 mg Oral Daily   . vitamins/minerals  1 tablet Oral Daily       ROS      Remainder of 10 point ROS as above or otherwise negative    PHYSICAL EXAM     Vitals:    12/22/16 1218   BP: 151/76   Pulse: 85   Resp: 16   Temp: (!) 96.9 F (36.1 C)   SpO2: 94%       Temperature: Temp  Min: 96.1 F (35.6 C)  Max: 98.2 F (36.8 C)  Pulse: Pulse  Min: 67  Max: 85  Respiratory: Resp  Min: 15  Max: 16  Non-Invasive BP: BP  Min: 134/61  Max: 168/70  Pulse Oximetry SpO2  Min: 94 %  Max: 100 %    Intake and Output Summary (Last  24 hours) at Date Time    Intake/Output Summary (Last 24 hours) at 12/22/16 1508  Last data filed at 12/22/16 1218   Gross per 24 hour   Intake              280 ml   Output             2000 ml   Net            -1720 ml       GEN APPEARANCE: Normal;  A&OX3  HEENT: Conjunctiva Clear  NECK: Supple;   CVS: RRR, S1, S2; No M/G/R  LUNGS: CTAB; No Wheezes; No Rhonchi: No rales  ABD: Soft; No TTP; + Normoactive BS  EXT: No edema; Pulses 2+ and intact, left leg in ice wrap  SKIN: No rash or Lesions  NEURO:; No Focal neurological deficits      LABS       Recent Labs  Lab 12/22/16  0506   WBC 6.35   RBC 3.90*   Hgb 11.1*   Hematocrit 35.2*   MCV 90.3   Platelets 129*         Recent Labs  Lab 12/22/16  0506   Sodium 135*   Potassium 4.3   Chloride 104   CO2 25   BUN 12   Creatinine 0.6   Glucose 145*   Calcium 8.3                         Microbiology Results     None           RADIOLOGY     Radiological Procedure personally reviewed and concur with radiologist reports unless stated otherwise.    XR Knee 1 Or 2 Views Left   Final Result    Status post left total knee arthroplasty.      Blair Promise, MD    12/21/2016 10:00 AM      US GUIDED NERVE BLOCK FOR ANESTHESIA   Final Result          Signed,  Adine Madura, PA-C  3:08 PM 12/22/2016

## 2016-12-22 NOTE — PT Progress Note (Signed)
Physical Therapy Note    [facility]  Physical Therapy Treatment    [address]  [place] [address]  [phone]    Patient:  [name] MRN#:  [id number]  Unit:  5NEW ORTHOPEDICS Room/Bed:  A213/Y865-78    Time of treatment: Start Time: 1350 Stop Time: 1415  Time Calculation (min): 25 min  PT Received On: 12/22/16    Treatment # 2    Precautions  Weight Bearing Status: no restrictions  Total Knee Replacement: knee immobilizer;nerve block;OOB  Precaution Instructions Given to Patient: Yes  Other Precautions: Falls    Patient's medical condition is appropriate for Physical Therapy intervention at this time.     Subjective:   Patient is agreeable to participation in the therapy session.  Pain: 8/10, cold applied    Objective:  Patient is seated in a recliner chair with peripheral IV, On-Q pump in place.    Exercise: ankle pumps, quad sets, gluteal sets, hip abduction, heel slides, short-arc quads, straight leg raises, and hamstring stretch.    Sit to stand: Sup x 1    Ambulation: 50 feet using rolling walker with CGA x 1 and verbal and tactile cues for technique, sequencing, posture, step length, speed, balance, and safety.    Stairs: not tested    Education:  Educated the patient and/or coach in precautions, home safety, home exercise, use of ice, car and bathroom transfers. Demonstrated good understanding of all.    Assessment: Patient and coach instructed in total joint exercise program.  Required verbal and tactile cues for technique.  Patient progressing toward physical therapy goals.    PMP - Progressive Mobility Protocol   PMP Activity: Step 7 - Walks out of Room  Distance Walked (ft) (Step 6,7): 50 Feet     RN notified of session outcome.     Plan:   Continue plan of care.    Signature: Jacqulyn Cane, PT, DPT

## 2016-12-22 NOTE — OT Eval Note (Signed)
[facility]   Occupational Therapy Evaluation     Patient: [name]    MRN#: [id number]   Unit: 5NEW ORTHOPEDICS  Bed: V409/W119-14    Time of treatment: Time Calculation  OT Received On: 12/22/16  Start Time: 1130  Stop Time: 1210  Time Calculation (min): 40 min    Consult received for [name] for OT Evaluation and Treatment.  Patient's medical condition is appropriate for Occupational therapy intervention at this time.    Medicare ID# - Medicare Sub. Num: [id number]    Assessment:   Pt is SBA for LB dressing with AE.  Supervision with functional transfers with good safety awareness noted.  Pt will be having friends stay with her to assist as needed once home with ADL.  No con't OT needs.   Discharge from therapy.    Interdisciplinary Communication   Patient is in chair and call bell/bed alarm set within reach.  Spoke with RN regarding results of evaluation.    Plan:   Discharge from Occupational Therapy.    Education:   Ed pt on role of OT and home safety/ fall prevention, AE.    Evaluation:     Precautions and Contraindications:   Precautions  Weight Bearing Status: no restrictions  Total Knee Replacement: OOB;nerve block;knee immobilizer  Precaution Instructions Given to Patient: Yes  Other Precautions:  (risk of fall)    Medical Diagnosis: Arthritis of knee, left [M17.12]    History of Present Illness: [name] is a 76 y.o. female admitted on 12/21/2016 with L TKR    Patient Active Problem List   Diagnosis   . Breast CA   . Hx of breast cancer   . Primary osteoarthritis of one knee, left        Past Medical/Surgical History:  Past Medical History:   Diagnosis Date   . Arthritis    . Asthma    . Bilateral cataracts     removed both   . Breast cancer     left lumpectomy with radiation tx   . Difficulty walking    . Gastroesophageal reflux disease    . Hypertensive disorder     not on med,   . Seasonal allergies       Past Surgical History:   Procedure Laterality Date   . ABCESS  DRAINAGE      from buttock   . BREAST SURGERY Left 01/2011    well diff CA 0.8 cm Grade 1 (T1) N0MX  ER +   . D&C WITH HYSTEROSCOPY     . EYE SURGERY Left     cataract   . HYSTERECTOMY           Social History/Prior level of function:  Prior Level of Function  Prior level of function: Ambulates independently;Independent with ADLs  Baseline Activity Level: Community ambulation  Driving: independent  Cooking: Yes  Employment: Retired Nurse, children's)  DME Currently at Home: Single point cane;Front wheel walker  Home Living Arrangements  Living Arrangements: Alone  Type of Home: House (split level)  Home Layout: Two level;Bed/bath upstairs;Stairs to enter with rails (add number in comment)  Bathroom Shower/Tub: Pension scheme manager: Standard  Bathroom Equipment: Landscape architect: Accessible  DME Currently at Home: Single point cane;Front wheel walker  Home Living - Notes / Comments:  (friends are staying with pt to A with ADL)    Orientation/Cognition: A & O x4    Pain: 7-8/10  L knee and lower back    Gross UE ROM: WFL    Gross UE strength: WFL    ADLs: SBA for LB dressing with AE.  Pt demonstrated good balance standing at the sink for G/H .      Functional mobility: RW Supervision with slow methodical steps 2/2 increased pain.  Supervision toilet transfer.  KI donned during mobility                Signature: Mychal Decarlo Denice Bors, OT

## 2016-12-22 NOTE — Progress Notes (Signed)
Patient interviewed and denies any anesthesia complications such as nausea or vomiting. +  sore throat.  + voiding    Post-op Day 1    Date:  12/22/2016 Time:  09:30 AM      Visit Type:  Inpatient Room #   [phone]    Subjective:  Symptoms Include:  " My back pain is worse than my knee pain"    Type of block: Adductor Canal Catheter  Surgery: Left TKA    Objective:  Pain Score  7/10 at knee and 9/10 in lower back.                     Motor Block  No                    Sensory Block  No                    Catheter site  Clean & dry    Assessment:  Catheter Functioning as expected  Patient reports pain below knee anterior lower leg.  She reports pain much worse in lower back. Patient states that she does have a history low back pain.    Plan:  Continuous Local Anesthetic (LA) Infusion at 8 ml/hr.  Heating pad ordered for lower back, nurse notified of pain levels. Patient did not take 0600 dose of Tramadol due to nausea. I suggested to patient that she could take now since nausea had improved. Patient just received 10/325 mg hydrocodone at 0854. Nurse Morrie Sheldon may call Dr. Anise Salvo to see if she can give Dilaudid IV if pain does not resolve.    Start or continue oral pain medications   Yes    Would pt receive block again?  Yes

## 2016-12-22 NOTE — PT Progress Note (Signed)
Physical Therapy Note    [facility]  Physical Therapy Attempt Note    Patient:  [name] MRN#:  [id number]  Unit:  5NEW ORTHOPEDICS Room/Bed: V409/W119-14      Patient not seen secondary to uncontrolled pain    RN notified. Will attempt later.    Jacqulyn Cane, PT, DPT

## 2016-12-22 NOTE — Progress Notes (Signed)
PROGRESS NOTE    Date Time: 12/22/16 9:53 PM  Patient Name: [name], [name]      Assessment:   1 Day Post-Op status post left total knee arthroplasty  Post-op Pain    Plan:   Continue current pain regimen, just received 1st dose of gabapentin  Continue elevation with wedge, ice for swelling  Doppler reviewed: No DVT  No sign of compartment syndrome    Subjective:   Had increased pain today, was up to 8/10 30 min ago. Doing a little better with elevation. Just got gabapentin. Tolerating oxycodone.    Physical Exam:     Vitals:    12/22/16 1948   BP: 143/63   Pulse: 85   Resp: 16   Temp: 98.8 F (37.1 C)   SpO2: 96%       Intake and Output Summary (Last 24 hours) at Date Time    Intake/Output Summary (Last 24 hours) at 12/22/16 2153  Last data filed at 12/22/16 2100   Gross per 24 hour   Intake              700 ml   Output             2400 ml   Net            -1700 ml       Left knee  - Aquacel Ag in place.  No erythema or drainage.  - Moderate knee swelling.  - Calf soft and non-tender.  - Thigh and calf compartments soft, no pain with passive stretch.  - Palpable dorsalis pedis and posterior tibial pulse  - Sensation to light touch intact throughout the left foot  - Motor: Intact toe flexion/extension and ankle dorsiflexion/plantarflexion      Labs:     Results     Procedure Component Value Units Date/Time    Basic Metabolic Panel [[id number]]  (Abnormal) Collected:  12/22/16 0506    Specimen:  Blood Updated:  12/22/16 0623     Glucose 145 (H) mg/dL      BUN 12 mg/dL      Creatinine 0.6 mg/dL      Calcium 8.3 mg/dL      Sodium 478 (L) mEq/L      Potassium 4.3 mEq/L      Chloride 104 mEq/L      CO2 25 mEq/L      Anion Gap 6.0    GFR [[id number]] Collected:  12/22/16 0506     Updated:  12/22/16 0623     EGFR >60.0    CBC and differential [[id number]]  (Abnormal) Collected:  12/22/16 0506    Specimen:  Blood from Blood Updated:  12/22/16 0623     WBC 6.35 x10 3/uL      Hgb 11.1 (L) g/dL      Hematocrit 95.2 (L) %       Platelets 129 (L) x10 3/uL      RBC 3.90 (L) x10 6/uL      MCV 90.3 fL      MCH 28.5 pg      MCHC 31.5 (L) g/dL      RDW 14 %      MPV 11.3 fL      Neutrophils 82.2 %      Lymphocytes Automated 6.8 %      Monocytes 8.5 %      Eosinophils Automated 1.7 %      Basophils Automated 0.3 %      Immature Granulocyte  0.5 %      Nucleated RBC 0.0 /100 WBC      Neutrophils Absolute 5.22 x10 3/uL      Abs Lymph Automated 0.43 (L) x10 3/uL      Abs Mono Automated 0.54 x10 3/uL      Abs Eos Automated 0.11 x10 3/uL      Absolute Baso Automated 0.02 x10 3/uL      Absolute Immature Granulocyte 0.03 x10 3/uL      Absolute NRBC 0.00 x10 3/uL               Signed by: Cathey Endow

## 2016-12-22 NOTE — Plan of Care (Signed)
Problem: Knee Surgery  Goal: Hemodynamic Stability  Outcome: Progressing   12/21/16 1501   Goal/Interventions addressed this shift   Hemodynamic stability  Monitor/assess vital signs;Maintain temperature within desired parameters;Monitor SpO2 and treat as needed;Monitor/assess surgical drainage output if drain is present;Monitor/assess lab values and report abnormal values;Monitor intake and output. Notify LIP if urine output is less than 30 ml/hr.     Goal: Pain at adequate level as identified by patient  Outcome: Not Progressing   12/22/16 1804   Goal/Interventions addressed this shift   Pain at adequate level as identified by patient  Identify patient comfort function goal;Evaluate if patient comfort function goal is met;Administer analgesics as prescribed to achieve pain goal;Reposition patient every 2 hours and PRN unless able to self-reposition     Goal: Stable Neurovascular Status  Outcome: Progressing   12/21/16 1501   Goal/Interventions addressed this shift   Stable neurovascular status  Assess and document plantar/dorsiflexion every 4 hours;Monitor/assess neurovascular status (pulses, capillary refill, pain, paresthesia, presence of edema);Monitor/assess for return of sensation after nerve block therapy if indicated

## 2016-12-22 NOTE — Progress Notes (Signed)
Sw met with pt to introduce self and discuss potential needs at [facility].  Pt confirmed all demographic information.  Pt has AD, no prior SNF admissions, and ambulates independently.  Pt completes ADL's independently.  Pt resides at private residence alone but reported that a friend of hers will be residing with her upon discharge for a couple of week.  Sw will continue to follow for dcp.    [name] L. Mattie Marlin, MSW       12/22/16 [phone]   Patient Type   Within 30 Days of Previous Admission? No   Healthcare Decisions   Interviewed: Patient   Orientation/Decision Making Abilities of Patient Alert and Oriented x3, able to make decisions   Advance Directive Patient has advance directive, copy in chart   Healthcare Agent Appointed Yes   Healthcare Agent's Name Bunnie Domino Marron   Healthcare Agent's Phone Number [phone]   Additional Emergency Contacts? N/.A   Prior to admission   Prior level of function Independent with ADLs;Ambulates independently   Type of Residence Private residence   Home Layout Two level   Have running water, electricity, heat, etc? Yes   Living Arrangements Alone   How do you get to your MD appointments? self   How do you get your groceries? self   Who fixes your meals? self   Who does your laundry? self   Who picks up your prescriptions? self   Dressing Independent   Grooming Independent   Feeding Independent   Bathing Independent   Toileting Independent   DME Currently at Home Single point cane;Front wheel walker   Name of Prior Assisted Living Facility N/A   Prior SNF admission? (Detail) N/A   Prior Rehab admission? (Detail) N/A   Adult Protective Services (APS) involved? No   Discharge Planning   Support Systems Children;Family members;Friends/neighbors   Patient expects to be discharged to: home   Anticipated [place] plan discussed with: Same as interviewed   [place] discussion contact information: N/A   Consults/Providers   PT Evaluation Needed 1   OT Evalulation Needed 1   SLP Evaluation Needed 2   Outcome  Palliative Care Screen Screened but did not meet criteria for intervention   Correct PCP listed in Epic? Yes   Important Message from [facility] Notice   Patient received 1st IMM Letter? Yes   Date of most recent IMM given: 12/21/16

## 2016-12-22 NOTE — Progress Notes (Signed)
PROGRESS NOTE    Date Time: 12/22/16 5:44 PM  Patient Name: [name],[name]  POD # : 1    Subjective:   Continued aching, burning pain around incision increasing throughout the day exacerbated by walking. Pain medications providing mild relief. Mild radiation to calf. Denies CP, SOB, palpitations or other systemic symptoms.    Objective:     Vitals:    12/22/16 1607   BP: 141/67   Pulse: 85   Resp: 16   Temp: (!) 96.8 F (36 C)   SpO2: 92%       Gen: Alert and oriented, cooperative  Extremities:  Dressing C/D/I, 2+ pedal pulses, full ROM with foot pumping and toe movement, 2+ edema, Yes left calf tightness   Neurological:  Sensation intact to light touch    Results     Procedure Component Value Units Date/Time    Basic Metabolic Panel [[id number]]  (Abnormal) Collected:  12/22/16 0506    Specimen:  Blood Updated:  12/22/16 0623     Glucose 145 (H) mg/dL      BUN 12 mg/dL      Creatinine 0.6 mg/dL      Calcium 8.3 mg/dL      Sodium 409 (L) mEq/L      Potassium 4.3 mEq/L      Chloride 104 mEq/L      CO2 25 mEq/L      Anion Gap 6.0    GFR [[id number]] Collected:  12/22/16 0506     Updated:  12/22/16 0623     EGFR >60.0    CBC and differential [[id number]]  (Abnormal) Collected:  12/22/16 0506    Specimen:  Blood from Blood Updated:  12/22/16 0623     WBC 6.35 x10 3/uL      Hgb 11.1 (L) g/dL      Hematocrit 57.8 (L) %      Platelets 129 (L) x10 3/uL      RBC 3.90 (L) x10 6/uL      MCV 90.3 fL      MCH 28.5 pg      MCHC 31.5 (L) g/dL      RDW 14 %      MPV 11.3 fL      Neutrophils 82.2 %      Lymphocytes Automated 6.8 %      Monocytes 8.5 %      Eosinophils Automated 1.7 %      Basophils Automated 0.3 %      Immature Granulocyte 0.5 %      Nucleated RBC 0.0 /100 WBC      Neutrophils Absolute 5.22 x10 3/uL      Abs Lymph Automated 0.43 (L) x10 3/uL      Abs Mono Automated 0.54 x10 3/uL      Abs Eos Automated 0.11 x10 3/uL      Absolute Baso Automated 0.02 x10 3/uL      Absolute Immature Granulocyte 0.03 x10 3/uL       Absolute NRBC 0.00 x10 3/uL             Assessment:   Stable post op with moderate effusion.  New onset calf pain NOS/gravity dependent swelling most likely etiology.      Plan:   STAT Venous U/Giles LLE to r/o DVT  Adding Gabapentin 300 mg TID for post op neuropathic pain as adjunct to medical therapy.  Continue to elevate LLE and ice 20 min/hour. NV checks q hour.  Re-evaluate in AM.  Signed by: Thurmond Butts Barry Culverhouse

## 2016-12-22 NOTE — Progress Notes (Signed)
Patient c/o 7/10 pain to left knee despite scheduled and PRN pain medications, elevation and ice.  Patient describes pain as "throbbing to my entire left leg".  Denies any calf pain.  Dr. Anise Salvo aware.  No new orders given.  Will stop by to see patient after office hours.

## 2016-12-23 LAB — HEMOGLOBIN AND HEMATOCRIT, BLOOD
Hematocrit: 32.3 % — ABNORMAL LOW (ref 37.0–47.0)
Hgb: 10.6 g/dL — ABNORMAL LOW (ref 12.0–16.0)

## 2016-12-23 LAB — BASIC METABOLIC PANEL
Anion Gap: 8 (ref 5.0–15.0)
BUN: 9 mg/dL (ref 7–19)
CO2: 23 mEq/L (ref 22–29)
Calcium: 8.2 mg/dL (ref 7.9–10.2)
Chloride: 102 mEq/L (ref 100–111)
Creatinine: 0.6 mg/dL (ref 0.6–1.0)
Glucose: 126 mg/dL — ABNORMAL HIGH (ref 70–100)
Potassium: 3.8 mEq/L (ref 3.5–5.1)
Sodium: 133 mEq/L — ABNORMAL LOW (ref 136–145)

## 2016-12-23 LAB — GFR: EGFR: >60

## 2016-12-23 MED ORDER — ON-Q PUMP SINGLE FLOW
Status: AC
Start: 2016-12-23 — End: 2016-12-24
  Filled 2016-12-23: qty 550

## 2016-12-23 NOTE — Plan of Care (Signed)
Problem: Knee Surgery  Goal: Pain at adequate level as identified by patient  Outcome: Progressing   12/23/16 0313   Goal/Interventions addressed this shift   Pain at adequate level as identified by patient  Identify patient comfort function goal;Evaluate if patient comfort function goal is met;Administer analgesics as prescribed to achieve pain goal   Pain managed w/ scheduled and PRN medications. Will monitor.   Goal: Stable Neurovascular Status  Outcome: Progressing   12/23/16 0313   Goal/Interventions addressed this shift   Stable neurovascular status  Assess and document plantar/dorsiflexion every 4 hours;Monitor/assess neurovascular status (pulses, capillary refill, pain, paresthesia, presence of edema);Monitor/assess for return of sensation after nerve block therapy if indicated;VTE prevention: administer anticoagulant(s) and/or apply anti-embolism stockings/devices as ordered   CNB @ 8 ml/hr.   Goal: Mobility/activity is maintained at optimum level for patient  Outcome: Progressing   12/23/16 0313   Goal/Interventions addressed this shift   Mobility/activity is maintained at optimum level for patient Administer analgesics as prescribed to achieve pain goal;Review weight bearing status with patient/patient care companion;PT and/or OT evaluation and treatment if ordered;Out of bed to chair if indicated with assistance as needed;Ambulate equal to or greater than 50 feet;Dangle/stand at bedside if indicated with assistance as needed;Teach/review/reinforce knee precautions with patient/patient care companion (no pillow under knee, lock out knee flexion feature on bed)

## 2016-12-23 NOTE — Progress Notes (Addendum)
Post-op Day 2    Date:  12/23/2016 Time:  1:19 PM      Visit Type:  Inpatient Room #   [phone]    Subjective:  " Pain level is high today".    Objective:  Pain Score  7                    Motor Block  No                    Sensory Block  No                    Catheter site  Clean & dry    Assessment:  Catheter Functioning as expected    Plan:  Continuous Local Anesthetic (LA) Infusion, increased to 12 ml/hr. Taking Oxycodone prn    Would pt receive block again?  Yes

## 2016-12-23 NOTE — PT Progress Note (Signed)
Physical Therapy Note    [facility]  Physical Therapy Attempt Note    Patient:  [name] MRN#:  [id number]  Unit:  5NEW ORTHOPEDICS Room/Bed: I347/Q259-56      Patient not seen secondary to Pt having breakfast. A.K.[name] PT      RN notified. Will attempt later.

## 2016-12-23 NOTE — Progress Notes (Signed)
[facility]  HOSPITALIST  PROGRESS NOTE      Patient: [name]  Date: 12/23/2016   LOS: 1 Days  Admission Date: 12/21/2016   MRN: [id number]  Attending: Dr. Dolores Frame     ASSESSMENT/PLAN     [name] is a 76 y.o. female Hx breast Ca admitted with s/p left knee arthroplasty POD # 2      1. osteoarthritis s/p left knee arthroplasty by Dr. Anise Salvo  - cont oxycodone, tramadol prn, neurontin  - PT OT  - ASA BID  2. Seasonal allergies- cont zyrtec  3. Elevated blood pressures- may be due to pain, BPs 140s close monitoring    Analgesia: Percocet oral    Nutrition: Regular Diet      Safety Checklist  DVT prophylaxis: Chemical   Foley: Not present   IVs:  None   PT/OT: Ordered   Daily CBC & or Chem ordered: Yes, due to clinical and lab instability       Patient Lines/Drains/Airways Status    Active PICC Line / CVC Line / PIV Line / Drain / Airway / Intraosseous Line / Epidural Line / ART Line / Line / Wound / Pressure Ulcer / NG/OG Tube     Name:   Placement date:   Placement time:   Site:   Days:    CPNB Catheter 12/21/16 Adductor canal/Mid-thigh femoral Left  12/21/16    1478    Adductor canal/Mid-thigh femoral    1    Peripheral IV 12/21/16 Right Hand  12/21/16    0648    Hand    1    Incision Site 12/21/16 Knee Left  12/21/16    0828      1                MD/RN rounds: yes       Code Status: full code    DISPO: home with family    Family Contact: refer to chart  Care Plan discussed with nursing, consultants, case manager.       SUBJECTIVE     [name] states had some nausea. Ate breakfast this AM. Increased pain with PT.    MEDICATIONS     Current Facility-Administered Medications   Medication Dose Route Frequency   . acetaminophen  1,000 mg Oral Q8H SCH   . aspirin EC  325 mg Oral BID   . cetirizine  10 mg Oral Daily   . ferrous sulfate  324 mg Oral QAM W/BREAKFAST   . gabapentin  300 mg Oral Q8H SCH   . montelukast  10 mg Oral QHS   . senna-docusate  2 tablet Oral BID   . traMADol  50 mg Oral 4 times per day    . vitamin C  500 mg Oral Daily   . vitamins/minerals  1 tablet Oral Daily       ROS     Remainder of 10 point ROS as above or otherwise negative    PHYSICAL EXAM     Vitals:    12/23/16 1213   BP: 140/64   Pulse: 83   Resp: 16   Temp: 99.1 F (37.3 C)   SpO2: 97%       Temperature: Temp  Min: 96.8 F (36 C)  Max: 99.1 F (37.3 C)  Pulse: Pulse  Min: 82  Max: 85  Respiratory: Resp  Min: 16  Max: 16  Non-Invasive BP: BP  Min: 122/60  Max: 147/67  Pulse  Oximetry SpO2  Min: 92 %  Max: 99 %    Intake and Output Summary (Last 24 hours) at Date Time    Intake/Output Summary (Last 24 hours) at 12/23/16 1500  Last data filed at 12/23/16 1213   Gross per 24 hour   Intake              420 ml   Output             1050 ml   Net             -630 ml       GEN APPEARANCE: Normal;  A&OX3  HEENT: Conjunctiva Clear  NECK: Supple;   CVS: RRR, S1, S2; No M/G/R  LUNGS: CTAB; No Wheezes; No Rhonchi: No rales  ABD: Soft; No TTP; + Normoactive BS  EXT: No edema; Pulses 2+ and intact, left leg in ice wrap  SKIN: No rash or Lesions  NEURO:; No Focal neurological deficits      LABS       Recent Labs  Lab 12/23/16  0501 12/22/16  0506   WBC  --  6.35   RBC  --  3.90*   Hgb 10.6* 11.1*   Hematocrit 32.3* 35.2*   MCV  --  90.3   Platelets  --  129*         Recent Labs  Lab 12/23/16  0501 12/22/16  0506   Sodium 133* 135*   Potassium 3.8 4.3   Chloride 102 104   CO2 23 25   BUN 9 12   Creatinine 0.6 0.6   Glucose 126* 145*   Calcium 8.2 8.3                         Microbiology Results     None           RADIOLOGY     Radiological Procedure personally reviewed and concur with radiologist reports unless stated otherwise.    US Venous Duplex Doppler Leg Left   Final Result         1. No evidence of deep vein thrombosis in the left lower extremity.   2. Complex fluid collection at the region of the left popliteal fossa,   likely related to recent surgery.      Georgiana Spinner, MD    12/22/2016 7:02 PM      XR Knee 1 Or 2 Views Left   Final Result     Status post left total knee arthroplasty.      Blair Promise, MD    12/21/2016 10:00 AM      US GUIDED NERVE BLOCK FOR ANESTHESIA   Final Result          Signed,  Adine Madura, PA-C  3:00 PM 12/23/2016

## 2016-12-23 NOTE — PT Progress Note (Signed)
Physical Therapy Note    [facility]  Physical Therapy Treatment    Patient:  [name] Donate MRN#:  [id number]  Unit:  5NEW ORTHOPEDICS Room/Bed:  N829/F621-30      Recommendation  Discharge Recommendation: Home with no needs;Home with home health PT  DME Recommended for Discharge: Front wheel walker  PT - Next Visit Recommendation: 12/24/16  PT Frequency: 6-7x/wk    Recommended mode of transportation at discharge:  car  PMP - Progressive Mobility Protocol   PMP Activity: Step 7 - Walks out of Room  Distance Walked (ft) (Step 6,7): 80 Feet    Assessment: After to day's treatment Pt was able to walk x 80' with R/W ad CG assistance. She also worked with active exs of LLE and was assisted with bathroom skills. Pt capable of active SAQ and SLR   Assessment: Decreased LE ROM;Decreased LE strength;Decreased endurance/activity tolerance;Decreased functional mobility;Gait impairment, resulting in continued difficulty with ind mobility skills.  Prognosis: Good;With continued PT status post acute discharge      Interdisciplinary Communication:   Patient up in bed side chair with alarm activated; call bell within reach. Updated white communication board in room with patient's current mobility status up to date.  Communicated with attending nurse regarding PT out come.    Education:   Further education provided to patient on  safety with mobility and ADLs, falls prevention.  Demonstrated good understanding with it. Will benefit from further training to focus on ROM, muscle strength and ambulation skills.      Plan:   Goals  Pt Will Roll Left:  (Pt was in bed side chair)  Pt Will Go Supine To Sit:  (Pt was in bed side chair)  Pt Will Perform Sit To Supine:  (Pt was in bed side chair)  Pt Will Perform Sit to Stand:  ( noit met, needed min assist)  Pt Will Transfer Bed/Chair:  (not met, needed CG assist)  Pt Will Ambulate:  (not met, needed R/W and CG assist x 80')  Pt Will Go Up / Down Stairs:  (did not attempt to  day. to try to morrow)  Pt Will Perform Home Exer Program:  (met. New to do them ind)  Plan  Risks/Benefits/POC Discussed with Pt/Family: With patient;With family  Treatment/Interventions: Exercise;Gait training;Functional transfer training;LE strengthening/ROM;Endurance training;Patient/family training;Continued evaluation  PT Frequency: 6-7x/wk    Continue plan of care.    ------------------------------------------------------------------------------------------------------------------    Time of treatment:   Start Time: 1125 Stop Time: 1210  Time Calculation (min): 45 min  PT Received On: 12/23/16      Treatment #        Precautions  Weight Bearing Status: no restrictions  Total Knee Replacement: knee immobilizer          Patient's medical condition is appropriate for Physical Therapy intervention at this time.     Subjective:   Patient is agreeable to participation in the therapy session. Nursing clears patient for therapy.      Pain Assessment  Pain Assessment:  (45/10 surgery site with movement)          Objective:  Patient is seated in a bedside chair with no medical equipment in place.  Cognition/Neuro Status  Arousal/Alertness:  (all faculties WFL)  Behavior: cooperative    Functional Mobility  Rolling:  (Pt was in bed side chair)  Sit to Stand:  (with min assist)    Transfers  Bed to Chair:  (with R/W and CG assist)  Device Used for Functional Transfer: front-wheeled walker    Locomotion  Ambulation:  (80' with R/W and CG assist)  Pattern:  (uneven steps, poor posture)  Stair Management:  (did not attempt)    Therapeutic Exercise  Straight Leg Raise: active exs x 7 reps each LLE as per TKR protocol               Treatment Activities: Pt was instructed in Therapeutic Exercise:  Patient instructed in ankle pumps, quad sets, glut sets, etc as per TKR protocol x 7 reps each.  Patient required verbal cues for correct technique.  Patient demonstrates  good understanding of exercises. Pt was also instructed in  in ambulation on level surfaces to improve gait mechanics and maximize safety. Verbal cues were rendered through out for foot placement, sequencing tips for a reciprocal pattern, equal step length, good cadence, ability to change direction and avoid obstacles. Pt was assisted in bathroom  Skills too.           Signature: Barbaraann Boys, PT

## 2016-12-23 NOTE — Progress Notes (Signed)
PROGRESS NOTE    Date Time: 12/23/16 8:44 AM  Patient Name: [name],[name]      Assessment:   2 Days Post-Op status post left total knee arthroplasty. Improving.    Plan:   Continue physical therapy  DVT prophylaxis: TEDs, foot pumps, ASA  D/C planning. Probable D/C to home tomorrow     Subjective:   Pain has improved overnight. Tolerating current pain medication regimen. Slow with ambulation this AM. Denies systemic symptoms. Voiding without difficulty. Denies calf pain.    Physical Exam:     Vitals:    12/23/16 0744   BP: 147/67   Pulse: 82   Resp: 16   Temp: 97.5 F (36.4 C)   SpO2: 99%       Intake and Output Summary (Last 24 hours) at Date Time    Intake/Output Summary (Last 24 hours) at 12/23/16 0844  Last data filed at 12/23/16 0444   Gross per 24 hour   Intake              700 ml   Output             1650 ml   Net             -950 ml       Left knee  - Aquacel Ag in place.  No erythema or drainage.  - Calf soft and non-tender.  - Palpable dorsalis pedis and posterior tibial pulse  - Sensation to light touch intact throughout the left foot  - Motor: Intact toe flexion/extension and ankle dorsiflexion/plantarflexion    Venous doppler U/Giles: negative for DVT or significant fluid collection    Labs:     Results     Procedure Component Value Units Date/Time    Basic metabolic panel [[id number]]  (Abnormal) Collected:  12/23/16 0501    Specimen:  Blood Updated:  12/23/16 0603     Glucose 126 (H) mg/dL      BUN 9 mg/dL      Creatinine 0.6 mg/dL      Calcium 8.2 mg/dL      Sodium 478 (L) mEq/L      Potassium 3.8 mEq/L      Chloride 102 mEq/L      CO2 23 mEq/L      Anion Gap 8.0    GFR [[id number]] Collected:  12/23/16 0501     Updated:  12/23/16 0603     EGFR >60.0    Hemoglobin and hematocrit, blood [[id number]]  (Abnormal) Collected:  12/23/16 0501    Specimen:  Blood Updated:  12/23/16 0511     Hgb 10.6 (L) g/dL      Hematocrit 95.2 (L) %               Signed by: Alycia Rossetti Sederick Jacobsen

## 2016-12-23 NOTE — Plan of Care (Signed)
Problem: Pain  Goal: Pain at adequate level as identified by patient  Outcome: Progressing  Pain appears under control, VSS WNL, no distress noted, continue to monitor.   12/23/16 1109   Goal/Interventions addressed this shift   Pain at adequate level as identified by patient Identify patient comfort function goal;Assess for risk of opioid induced respiratory depression, including snoring/sleep apnea. Alert healthcare team of risk factors identified.;Assess pain on admission, during daily assessment and/or before any "as needed" intervention(s);Reassess pain within 30-60 minutes of any procedure/intervention, per Pain Assessment, Intervention, Reassessment (AIR) Cycle

## 2016-12-24 ENCOUNTER — Other Ambulatory Visit: Payer: Self-pay

## 2016-12-24 LAB — BASIC METABOLIC PANEL
Anion Gap: 9 (ref 5.0–15.0)
BUN: 17 mg/dL (ref 7–19)
CO2: 23 mEq/L (ref 22–29)
Calcium: 8.4 mg/dL (ref 7.9–10.2)
Chloride: 104 mEq/L (ref 100–111)
Creatinine: 0.8 mg/dL (ref 0.6–1.0)
Glucose: 114 mg/dL — ABNORMAL HIGH (ref 70–100)
Potassium: 4 mEq/L (ref 3.5–5.1)
Sodium: 136 mEq/L (ref 136–145)

## 2016-12-24 LAB — GFR: EGFR: >60

## 2016-12-24 LAB — HEMOGLOBIN AND HEMATOCRIT, BLOOD
Hematocrit: 32.9 % — ABNORMAL LOW (ref 37.0–47.0)
Hgb: 10.5 g/dL — ABNORMAL LOW (ref 12.0–16.0)

## 2016-12-24 MED ORDER — GABAPENTIN 300 MG PO CAPS
300.0000 mg | ORAL_CAPSULE | Freq: Two times a day (BID) | ORAL | 0 refills | Status: DC
Start: 2016-12-24 — End: 2018-01-14
  Filled 2016-12-24: qty 30, 15d supply, fill #0

## 2016-12-24 MED ORDER — ASPIRIN 325 MG PO TBEC
325.0000 mg | DELAYED_RELEASE_TABLET | Freq: Two times a day (BID) | ORAL | Status: AC
Start: 2016-12-24 — End: 2017-01-21

## 2016-12-24 MED ORDER — SENNOSIDES-DOCUSATE SODIUM 8.6-50 MG PO TABS
2.0000 | ORAL_TABLET | Freq: Two times a day (BID) | ORAL | 0 refills | Status: AC | PRN
Start: 2016-12-24 — End: ?

## 2016-12-24 MED ORDER — OXYCODONE HCL 5 MG PO TABS
ORAL_TABLET | ORAL | 0 refills | Status: DC
Start: 2016-12-24 — End: 2018-01-14
  Filled 2016-12-24: qty 60, 10d supply, fill #0

## 2016-12-24 MED ORDER — ASPIRIN EC 325 MG PO TBEC
325.0000 mg | DELAYED_RELEASE_TABLET | Freq: Two times a day (BID) | ORAL | 0 refills | Status: DC
Start: 2016-12-24 — End: 2018-01-14

## 2016-12-24 NOTE — Discharge Summary -  Nursing (Signed)
Discharge instructions and follow up appt discussed with pt and family. Continuous nerve block removed. Two filled prescriptions sent home with pt. All questions answered.

## 2016-12-24 NOTE — Progress Notes (Signed)
Post-op Day 3    Date:  12/24/2016 Time:  9:31 AM      Visit Type:  Inpatient Room #   [phone]    Subjective:  No complaints    Type of block: Adductor Canal Catheter  Surgery: Left TKA    Objective:  Pain Score  6/10- tolerable per patient                    Motor Block  No                    Sensory Block  No                    Catheter site  Clean & dry    Assessment:  Catheter Functioning as expected    Plan:  Continuous Local Anesthetic (LA) Infusion    Start or continue oral pain medications   Yes, Gabapentin as prescribed and oxycodone prn.    Would pt receive block again?  Yes

## 2016-12-24 NOTE — Plan of Care (Signed)
Problem: Knee Surgery  Goal: Hemodynamic Stability  Outcome: Progressing  VSS and AUO  Goal: Stable Neurovascular Status  Outcome: Progressing  Good pulses and sensation intact  Goal: Mobility/activity is maintained at optimum level for patient  Outcome: Progressing  Pt ambulated with PT and up to chair

## 2016-12-24 NOTE — PT Progress Note (Signed)
Physical Therapy Note  [facility]  Physical Therapy Treatment    Patient:  [name] MRN#:  [id number]  Unit:  5NEW ORTHOPEDICS Room/Bed:  V409/W119-14      Recommendation  Discharge Recommendation: Home with outpatient PT  DME Recommended for Discharge:  (issued R/W)  PT - Next Visit Recommendation: 12/25/16  PT Frequency: 6-7x/wkRecommended mode of transportation at discharge:  car    PMP - Progressive Mobility Protocol   PMP Activity: Step 7 - Walks out of Room  Distance Walked (ft) (Step 6,7): 80 Feet    Assessment:   After today's treatment patient able to work with exs of LLE, gait training and going up and down stairs with rail and cane and (S). Improving with knee flexion and active SAQ and SLR.  Assessment: Decreased LE ROM;Decreased LE strength;Decreased endurance/activity tolerance;Decreased functional mobility;Gait impairment, resulting in continued difficulty with mobility skills.  Prognosis: Good;With continued PT status post acute discharge      Interdisciplinary Communication:   Patient up in bed side chair with alarm activated; call bell within reach. Updated white communication board in room with patient's current mobility status up to date.  Communicated with attending nurse regarding PT out come    Education:   Further education provided to patient on  safety with mobility and ADLs, falls prevention.  Demonstrated good understanding with it Will benefit from further training to focus on ROM, muscle strength and ambulation skills.      Plan:   Goals  Pt Will Roll Left:  (Pt was in the bathroom)  Pt Will Go Supine To Sit:  (did not attempt)  Pt Will Perform Sit To Supine:  (did not attempt)  Pt Will Perform Sit to Stand:  (not met, needed (S))  Pt Will Transfer Bed/Chair:  (not met, needed R/W and (S))  Pt Will Ambulate:  (not met, walked x 11' with R/W and (S))  Pt Will Go Up / Down Stairs:  (met with one rail and cane and (S))  Pt Will Perform Home Exer Program:  (met. New:  ind)  Plan  Risks/Benefits/POC Discussed with Pt/Family: With patient;With patient/family  Treatment/Interventions: Exercise;Gait training;Stair training;Functional transfer training;LE strengthening/ROM;Endurance training;Patient/family training;Continued evaluation  PT Frequency: 6-7x/wk    Continue plan of care.    ------------------------------------------------------------------------------------------------------------------    Time of treatment:   Start Time: 1010 Stop Time: 1050  Time Calculation (min): 40 min  PT Received On: 12/24/16      Treatment #        Precautions  Total Knee Replacement: knee immobilizer          Patient's medical condition is appropriate for Physical Therapy intervention at this time.     Subjective:   Patient is agreeable to participation in the therapy session. Nursing clears patient for therapy.      Pain Assessment  Pain Assessment:  (3/10 surgery site with exs)          Objective:  Patient is out of bed, ambulating with no medical equipment in place.                                                               Cognition/Neuro Status  Arousal/Alertness:  (all faculties [facility])  Behavior: cooperative    Insurance claims handler  Sit to Stand:  (with (S))    Transfers  Bed to Chair:  (with r/W and (S))  Device Used for Functional Transfer: front-wheeled walker    Locomotion  Ambulation:  (80' with R/W and (S))  Pattern:  (almost equal-re ciprocal steps, poor posture)  Stair Management:  (8 steps wth rail and cane ans (S))    Therapeutic Exercise  Straight Leg Raise:  (active exs as per TKR x 10 reps each)               Treatment Activities:  Pt was instructed in ankle pumps, quad sets, glut sets , etc as per TKR exs protocol x 10 reps each.  Patient required verbal and tactile cues for correct technique.  Patient demonstrates good understanding of exercises.  Pt was also instructed in  in ambulation on level surfaces to improve gait mechanics and maximize safety. Verbal cues were  rendered through out for foot placement, sequencing tips for a reciprocal pattern, equal step length, good cadence, ability to change direction and avoid obstacles, and correct posture              Signature: Brae Schaafsma Cori Razor, PT

## 2016-12-24 NOTE — Progress Notes (Signed)
PROGRESS NOTE    Date Time: 12/24/16 7:15 AM  Patient Name: [name]      Assessment:   3 Days Post-Op status post left total knee arthroplasty    Plan:   Physical therapy  DVT prophylaxis: TEDs, foot pumps, ASA  D/C planning: D/C home today if clears PT    Subjective:   Feeling much better today. Pain much improved. Tolerating meds. Sleeping upon entering room.     Physical Exam:     Vitals:    12/24/16 0423   BP: 110/52   Pulse: 78   Resp: 12   Temp: 97.8 F (36.6 C)   SpO2: 96%       Intake and Output Summary (Last 24 hours) at Date Time    Intake/Output Summary (Last 24 hours) at 12/24/16 0715  Last data filed at 12/23/16 2300   Gross per 24 hour   Intake              760 ml   Output             1700 ml   Net             -940 ml       Left knee  - Aquacel Ag in place.  No erythema or drainage.  - Calf soft and non-tender.  - Palpable dorsalis pedis and posterior tibial pulse  - Sensation to light touch intact throughout the left foot  - Motor: Intact toe flexion/extension and ankle dorsiflexion/plantarflexion      Labs:     Results     Procedure Component Value Units Date/Time    Basic metabolic panel [[id number]]  (Abnormal) Collected:  12/24/16 0500    Specimen:  Blood Updated:  12/24/16 0617     Glucose 114 (H) mg/dL      BUN 17 mg/dL      Creatinine 0.8 mg/dL      Calcium 8.4 mg/dL      Sodium 528 mEq/L      Potassium 4.0 mEq/L      Chloride 104 mEq/L      CO2 23 mEq/L      Anion Gap 9.0    GFR [[id number]] Collected:  12/24/16 0500     Updated:  12/24/16 0617     EGFR >60.0    Hemoglobin and hematocrit, blood [[id number]]  (Abnormal) Collected:  12/24/16 0500    Specimen:  Blood Updated:  12/24/16 0521     Hgb 10.5 (L) g/dL      Hematocrit 03.4 (L) %               Signed by: Cathey Endow

## 2016-12-24 NOTE — Progress Notes (Signed)
Home Health Referral          Referral from Legrand Pitts (Case Manager) for home health care upon discharge.          Home Health Discharge Information    Your doctor has ordered medical equipment for you while you recuperate at home, to assist you in the transition from hospital to home.          [employer]:  Name of DME Agency: [facility] (Rolling walker and bedside commode delivered to pt in her hospital room)        The above services were set up by:  Pearlie Oyster RN, BSN [facility] Liaison)   Phone: [phone]          Signed by: Pearlie Oyster  Date Time: 12/24/16 10:17 AM

## 2016-12-24 NOTE — Plan of Care (Signed)
Problem: Knee Surgery  Goal: Pain at adequate level as identified by patient  Outcome: Progressing   12/24/16 0239   Goal/Interventions addressed this shift   Pain at adequate level as identified by patient  Identify patient comfort function goal;Evaluate if patient comfort function goal is met;Administer analgesics as prescribed to achieve pain goal   Pain managed w/ scheduled and PRN medications. Will continue to monitor.   Goal: Stable Neurovascular Status  Outcome: Progressing   12/24/16 0239   Goal/Interventions addressed this shift   Stable neurovascular status  Assess and document plantar/dorsiflexion every 4 hours;Monitor/assess neurovascular status (pulses, capillary refill, pain, paresthesia, presence of edema);Monitor/assess for return of sensation after nerve block therapy if indicated;VTE prevention: administer anticoagulant(s) and/or apply anti-embolism stockings/devices as ordered   WNL.

## 2016-12-24 NOTE — Progress Notes (Signed)
[facility]  HOSPITALIST  PROGRESS NOTE      Patient: [name]  Date: 12/24/2016   LOS: 2 Days  Admission Date: 12/21/2016   MRN: [id number]  Attending: Dr. Mayford Knife     ASSESSMENT/PLAN     [name] is a 76 y.o. female Hx breast Ca admitted with s/p left knee arthroplasty POD # 3      1. osteoarthritis s/p left knee arthroplasty by Dr. Anise Salvo  - cont oxycodone, tramadol prn, neurontin, pain better controlled today  - PT OT  - ASA BID  - stable for d/c from medical standpoint  2. Seasonal allergies- cont zyrtec  3. Elevated blood pressures- improving now that pain better controlled, cont to monitor outpatient    Analgesia: Percocet oral    Nutrition: Regular Diet      Safety Checklist  DVT prophylaxis: Chemical   Foley: Not present   IVs:  None   PT/OT: Ordered   Daily CBC & or Chem ordered: Yes, due to clinical and lab instability       Patient Lines/Drains/Airways Status    Active PICC Line / CVC Line / PIV Line / Drain / Airway / Intraosseous Line / Epidural Line / ART Line / Line / Wound / Pressure Ulcer / NG/OG Tube     Name:   Placement date:   Placement time:   Site:   Days:    CPNB Catheter 12/21/16 Adductor canal/Mid-thigh femoral Left  12/21/16    8119    Adductor canal/Mid-thigh femoral    1    Peripheral IV 12/21/16 Right Hand  12/21/16    0648    Hand    1    Incision Site 12/21/16 Knee Left  12/21/16    0828      1                MD/RN rounds: yes       Code Status: full code    DISPO: home with family    Family Contact: refer to chart  Care Plan discussed with nursing, consultants, case manager.       SUBJECTIVE     [name] states had some nausea. Ate breakfast this AM. Increased pain with PT.    MEDICATIONS     Current Facility-Administered Medications   Medication Dose Route Frequency   . acetaminophen  1,000 mg Oral Q8H SCH   . aspirin EC  325 mg Oral BID   . cetirizine  10 mg Oral Daily   . ferrous sulfate  324 mg Oral QAM W/BREAKFAST   . gabapentin  300 mg Oral Q8H SCH   .  montelukast  10 mg Oral QHS   . senna-docusate  2 tablet Oral BID   . traMADol  50 mg Oral 4 times per day   . vitamin C  500 mg Oral Daily   . vitamins/minerals  1 tablet Oral Daily       ROS     Remainder of 10 point ROS as above or otherwise negative    PHYSICAL EXAM     Vitals:    12/24/16 0743   BP: 120/56   Pulse: 78   Resp: 15   Temp: 97.8 F (36.6 C)   SpO2: 99%       Temperature: Temp  Min: 96.8 F (36 C)  Max: 99.1 F (37.3 C)  Pulse: Pulse  Min: 78  Max: 87  Respiratory: Resp  Min: 12  Max:  18  Non-Invasive BP: BP  Min: 110/52  Max: 140/64  Pulse Oximetry SpO2  Min: 93 %  Max: 99 %    Intake and Output Summary (Last 24 hours) at Date Time    Intake/Output Summary (Last 24 hours) at 12/24/16 1149  Last data filed at 12/23/16 2300   Gross per 24 hour   Intake              760 ml   Output             1600 ml   Net             -840 ml       GEN APPEARANCE: Normal;  A&OX3  HEENT: Conjunctiva Clear  NECK: Supple;   CVS: RRR, S1, S2; No M/G/R  LUNGS: CTAB; No Wheezes; No Rhonchi: No rales  ABD: Soft; No TTP; + Normoactive BS  EXT: LLE edema  SKIN: No rash or Lesions  NEURO:; No Focal neurological deficits      LABS       Recent Labs  Lab 12/24/16  0500 12/23/16  0501 12/22/16  0506   WBC  --   --  6.35   RBC  --   --  3.90*   Hgb 10.5* 10.6* 11.1*   Hematocrit 32.9* 32.3* 35.2*   MCV  --   --  90.3   Platelets  --   --  129*         Recent Labs  Lab 12/24/16  0500 12/23/16  0501 12/22/16  0506   Sodium 136 133* 135*   Potassium 4.0 3.8 4.3   Chloride 104 102 104   CO2 23 23 25    BUN 17 9 12    Creatinine 0.8 0.6 0.6   Glucose 114* 126* 145*   Calcium 8.4 8.2 8.3                         Microbiology Results     None           RADIOLOGY     Radiological Procedure personally reviewed and concur with radiologist reports unless stated otherwise.    US Venous Duplex Doppler Leg Left   Final Result         1. No evidence of deep vein thrombosis in the left lower extremity.   2. Complex fluid collection at the region  of the left popliteal fossa,   likely related to recent surgery.      Georgiana Spinner, MD    12/22/2016 7:02 PM      XR Knee 1 Or 2 Views Left   Final Result    Status post left total knee arthroplasty.      Blair Promise, MD    12/21/2016 10:00 AM      US GUIDED NERVE BLOCK FOR ANESTHESIA   Final Result          Signed,  Adine Madura, PA-C  11:49 AM 12/24/2016

## 2016-12-24 NOTE — Discharge Instr - AVS First Page (Addendum)
Reason for your Hospital Admission:  Left knee replacement      Instructions for after your discharge:  Activity   Start range of motion exrcises at home.   Don't drive until your doctor says it's OK. And never drive while taking opioid pain medication.   Remember to take pain medications as directed.   Do not drink alcohol while taking pain medications.   You can weight bear as tolerated. Gradually wean from walker to a cane under the direction of your physical therapist.   Slowly bend and straighten your affected knee as far as you can. Do this several times a day.   When resting, keep your ankle elevated above the level of your heart. This helps keep swelling down.   Point and flex your foot, and rotate your ankle as much as possible during the first few weeks following surgery. Also, wiggle your toes as much as possible.   Use portable sequential compression devices as instructed for 2 weeks.    Incision care   You have a waterproof dressing in place.      You can shower with the waterproof dressing in place.   Please remove the waterproof dressing 7 days after you are discharged form the hospital.  At that time, the incision can be left open to air and does not need to be covered.   After the waterproof dressing is removed, you can get the incision wet in the shower.   Use an ice packor bag of frozen peas--or something similar--wrapped in a thin towel to reduce the swelling. Keep the foot elevated while you ice the knee. Apply the ice pack for55minutes; then remove it for33minutes. Repeat as needed. Icing helps reduce swelling.    Medication   Take pain medication as prescribed.   Take the anticoagulant (blood thinner) medication as prescribed.   If you need any pain medication refills, please contact the office 48 hours prior to running out of the medication. Refills are not done on weekends by the on call physician.  If you are going to run out of medication over the weekend, please  contact the office on Thursday or Friday to obtain refills.    Other precautions   Arrange your household to keep the items you need within reach.   Remove throw rugs, electrical cords, and anything else that may cause you to fall.   Use nonslip bath mats, grab bars, an elevated toilet seat, and a shower chair in your bathroom.   Use a cane, crutches, a walker, or handrails until your balance, flexibility, and strength improve, and you can put weight on your leg. And remember to ask for help from others when you need it.   Free up your hands so that you can use them to keep balance. Use a fanny pack, apron, or pockets to carry things.    Follow-up     Physical therapy has been ordered. Start physical therapy as soon as possible.   Follow-up with Dr. Anise Salvo at scheduled appointment 2 weeks post-op.    Notify Dr. Anise Salvo for any increased bleeding, redness, drainage, swelling, worsening pain, calf pain or swelling, or other concerning symptoms.      Report to the emergency room immediately for any chest pain or shortness of breath.     General Guidelines After Your Joint Replacement    Prevention and Recognition of Blood Clots  1. Remember to take your blood thinner medication (ie, Xarelto, aspirin) as directed for as long as  your surgeon has prescribed it.  2. Move! Pump your ankles when you're in the bed or chair, and take frequent short walks as tolerated. If your surgeon sent you home with the white TED stockings, wear them throughout the day and especially at night, taking them off only to shower. They are machine washable (delicate cycle).  3. Unrelieved pain in either calf, particularly if accompanied by lower leg swelling, tightness, redness, and warmth or heat in the area can indicate a possible blood clot. If this occurs, call your surgeon's office or visit the nearest emergency room for evaluation.  4. Chest pain, shortness of breath, fast heart rate and/or a "feeling of impending doom" can signify a  blood clot that has traveled to the heart or lungs. Call 911 immediately!    Prevention and Recognition of Infection  1. Follow your surgeon's guidelines for showering and changing the bandage. Make sure whoever changes the bandage thoroughly washes his/her hands, including nailbeds, with soap and water before beginning the procedure.  2. Do not touch or rub the incision or the gauze area of the bandage.  3. Do not soak in a bathtub, swim or otherwise immerse the incision area in water until cleared by your surgeon.  4. Do not apply any ointments, including lotions or even an antibiotic solution such as Bacitracin, until cleared by your surgeon.  5. Keep pets and other possible contaminants away from your incision!  6. Signs of infection include sudden and significantly increased swelling and/or throbbing pain even at rest at the incision site; angry, deep-red color; heat; and/or pus-like discharge. You may or may not have an elevated temperature. If any of these signs occur, call your surgeon's office. Note that some degree of redness and warmth around the incision are normal after surgery.  7. Extensive bruising is normal, and as you move around more may extend all the way down your leg as it follows gravity.  8. Be proactive about other possible infections. If you think you have an abscessed tooth, urinary tract or other kind of infection, visit your primary care doctor or dentist right away instead of taking a "wait-and-see" approach.  9. You should no longer use the Hibiclens antibacterial soap that you used prior to surgery.    Alleviating Constipation  1. It will most likely be a few days before you have your first bowel movement after surgery, due to the anesthesia, your relative immobility, and the effects of pain medication. To help counteract, stay hydrated -- prune juice and hot liquids such as coffee or tea can help get the bowels moving. Also take an over-the-counter stool softener and/or gentle  laxative as directed by your surgeon.  2. Move! Walking will help get your bowels moving.  3. Some people report relief by placing a warm heating pad over their lower belly while resting.  4. If you are still unable to have a bowel movement, contact your surgeon's office.  5. If you become unable to keep down any food that you eat, are unable to pass gas, and your abdomen becomes increasingly distended or swollen, go to the nearest emergency room.    Pain  1. Stay on top of your pain medication. Once your pain level gets too high, it's hard to play catch up with your pain medications. Definitely take it before your physical therapy appointments! (Do not take narcotics if you are driving yourself to your therapy!)  2. Keep ice on the joint 20 minutes at a time  throughout the day. Make sure that you have a layer of clothing or towel between the ice pack and your skin. If you have had a nerve block, the ice may not feel as cold as it actually is.  3. You can make your own ice packs by pouring one or two bottles of clear Karo syrup (from the baking aisle) into a gallon-size heavy duty freezer bag, placing that inside of another freezer bag to prevent leaks, and placing it in the freezer. This will stay cold for a long time and forms a nice, thick heavy gel. You can also use equal parts rubbing alcohol and water.    Please call your surgeon's office with any medical questions or concerns before going to the emergency room, unless a genuine emergency.   For general questions, contact the orthopedic navigator, Lendon Collar ([phone] Monday through Friday, or email [email or web address]).  IN CASE OF EMERGENCY, DIAL 911!        Home Health Discharge Information    Your doctor has ordered medical equipment for you while you recuperate at home, to assist you in the transition from hospital to home.      [employer]:  Name of DME Agency: [facility] (Rolling walker and  bedside commode delivered to pt in her hospital room)

## 2016-12-26 ENCOUNTER — Emergency Department: Payer: Medicare Other

## 2016-12-26 ENCOUNTER — Emergency Department
Admission: EM | Admit: 2016-12-26 | Discharge: 2016-12-26 | Disposition: A | Discharge status: Home / Self Care | Payer: Medicare Other | Attending: Emergency Medicine | Admitting: Emergency Medicine

## 2016-12-26 DIAGNOSIS — J45909 Unspecified asthma, uncomplicated: Secondary | ICD-10-CM | POA: Insufficient documentation

## 2016-12-26 DIAGNOSIS — L03116 Cellulitis of left lower limb: Secondary | ICD-10-CM | POA: Insufficient documentation

## 2016-12-26 DIAGNOSIS — Z7982 Long term (current) use of aspirin: Secondary | ICD-10-CM | POA: Insufficient documentation

## 2016-12-26 DIAGNOSIS — M1990 Unspecified osteoarthritis, unspecified site: Secondary | ICD-10-CM | POA: Insufficient documentation

## 2016-12-26 DIAGNOSIS — Z96652 Presence of left artificial knee joint: Secondary | ICD-10-CM | POA: Insufficient documentation

## 2016-12-26 DIAGNOSIS — Z79899 Other long term (current) drug therapy: Secondary | ICD-10-CM | POA: Insufficient documentation

## 2016-12-26 DIAGNOSIS — Z853 Personal history of malignant neoplasm of breast: Secondary | ICD-10-CM | POA: Insufficient documentation

## 2016-12-26 DIAGNOSIS — K219 Gastro-esophageal reflux disease without esophagitis: Secondary | ICD-10-CM | POA: Insufficient documentation

## 2016-12-26 DIAGNOSIS — R262 Difficulty in walking, not elsewhere classified: Secondary | ICD-10-CM | POA: Insufficient documentation

## 2016-12-26 DIAGNOSIS — L0390 Cellulitis, unspecified: Secondary | ICD-10-CM

## 2016-12-26 LAB — BASIC METABOLIC PANEL
Anion Gap: 11 (ref 5.0–15.0)
BUN: 17 mg/dL (ref 7–19)
CO2: 24 mEq/L (ref 22–29)
Calcium: 8.9 mg/dL (ref 7.9–10.2)
Chloride: 106 mEq/L (ref 100–111)
Creatinine: 0.6 mg/dL (ref 0.6–1.0)
Glucose: 102 mg/dL — ABNORMAL HIGH (ref 70–100)
Potassium: 4.1 mEq/L (ref 3.5–5.1)
Sodium: 141 mEq/L (ref 136–145)

## 2016-12-26 LAB — CBC AND DIFFERENTIAL
Absolute NRBC: 0 10*3/uL
Basophils Absolute Automated: 0.03 10*3/uL (ref 0.00–0.20)
Basophils Automated: 0.6 %
Eosinophils Absolute Automated: 0.09 10*3/uL (ref 0.00–0.70)
Eosinophils Automated: 1.9 %
Hematocrit: 32.9 % — ABNORMAL LOW (ref 37.0–47.0)
Hgb: 11 g/dL — ABNORMAL LOW (ref 12.0–16.0)
Immature Granulocytes Absolute: 0.02 10*3/uL
Immature Granulocytes: 0.4 %
Lymphocytes Absolute Automated: 0.66 10*3/uL (ref 0.50–4.40)
Lymphocytes Automated: 13.8 %
MCH: 29.1 pg (ref 28.0–32.0)
MCHC: 33.4 g/dL (ref 32.0–36.0)
MCV: 87 fL (ref 80.0–100.0)
MPV: 10.4 fL (ref 9.4–12.3)
Monocytes Absolute Automated: 0.4 10*3/uL (ref 0.00–1.20)
Monocytes: 8.4 %
Neutrophils Absolute: 3.58 10*3/uL (ref 1.80–8.10)
Neutrophils: 74.9 %
Nucleated RBC: 0 /100 WBC (ref 0.0–1.0)
Platelets: 192 10*3/uL (ref 140–400)
RBC: 3.78 10*6/uL — ABNORMAL LOW (ref 4.20–5.40)
RDW: 14 % (ref 12–15)
WBC: 4.78 10*3/uL (ref 3.50–10.80)

## 2016-12-26 LAB — GFR: EGFR: >60

## 2016-12-26 MED ORDER — SODIUM CHLORIDE 0.9 % IV MBP
1.0000 g | Freq: Once | INTRAVENOUS | Status: AC
Start: 2016-12-26 — End: 2016-12-26
  Administered 2016-12-26: 19:00:00 1 g via INTRAVENOUS
  Filled 2016-12-26: qty 1000

## 2016-12-26 MED ORDER — ACETAMINOPHEN 500 MG PO TABS
1000.0000 mg | ORAL_TABLET | Freq: Once | ORAL | Status: AC
Start: 2016-12-26 — End: 2016-12-26
  Administered 2016-12-26: 19:00:00 1000 mg via ORAL
  Filled 2016-12-26: qty 2

## 2016-12-26 MED ORDER — CEPHALEXIN 500 MG PO CAPS
500.0000 mg | ORAL_CAPSULE | Freq: Three times a day (TID) | ORAL | 0 refills | Status: AC
Start: 2016-12-26 — End: 2017-01-02

## 2016-12-26 NOTE — ED Triage Notes (Signed)
Pt had L TKR on Monday, 12/21/16, increased pain, redness and swelling L upper leg. No fevers at home.

## 2016-12-26 NOTE — Discharge Instructions (Signed)
Dear Ms. Tylene Fantasia Leitch:    I appreciate your choosing the [facility] for your healthcare needs, and hope your visit today was EXCELLENT.    Instructions:  Please follow-up with Dr. Anise Salvo on Tuesday.     Return to the Emergency Department for any worsening symptoms or concerns.    Below is some information that our patients often find helpful.    We wish you good health and please do not hesitate to contact us if we can ever be of any assistance.    Sincerely,  Latif, Antony Contras, MD  [facility]    ________________________________________________________________    If you do not continue to improve or your condition worsens, please contact your doctor or return immediately to the Emergency Department.    Thank you for choosing [facility] for your emergency care needs.  We strive to provide EXCELLENT care to you and your family.      DOCTOR REFERRALS  Call [phone] if you need any further referrals and we can help you find a primary care doctor or specialist.  Also, available online at:  [email or web address]    YOUR CONTACT INFORMATION  Before leaving please check with registration to make sure we have an up-to-date contact number.  You can call registration at [phone] to update your information.  For questions about your hospital bill, please call [phone].  For questions about your Emergency Dept Physician bill please call [phone].      FREE HEALTH SERVICES  If you need help with health or social services, please call 2-1-1 for a free referral to resources in your area.  2-1-1 is a free service connecting people with information on health insurance, free clinics, pregnancy, mental health, dental care, food assistance, housing, and substance abuse counseling.  Also, available online at:  http://[email or web address].org    MEDICAL RECORDS AND TESTS  Certain laboratory test results do not come back the same day, for  example urine cultures.   We will contact you if other important findings are noted.  Radiology films are often reviewed again to ensure accuracy.  If there is any discrepancy, we will notify you.      Please call [phone] to pick up a complimentary CD of any radiology studies performed.  If you or your doctor would like to request a copy of your medical records, please call [phone].      ORTHOPEDIC INJURY   Please know that significant injuries can exist even when an initial x-ray is read as normal or negative.  This can occur because some fractures (broken bones) are not initially visible on x-rays.  For this reason, close outpatient follow-up with your primary care doctor or bone specialist (orthopedist) is required.    MEDICATIONS AND FOLLOWUP  Please be aware that some prescription medications can cause drowsiness.  Use caution when driving or operating machinery.    The examination and treatment you have received in our Emergency Department is provided on an emergency basis, and is not intended to be a substitute for your primary care physician.  It is important that your doctor checks you again and that you report any new or remaining problems at that time.      24 HOUR PHARMACIES  [facility] - [address] [place], [place] [address] (1.4 miles, 7 minutes)  [facility] - [address] [place], [place] [address] (6.5 miles, 13 minutes)  Handout with directions available on  request

## 2016-12-26 NOTE — ED Provider Notes (Signed)
Physician/Midlevel provider first contact with patient: 12/26/16 1728         [facility] EMERGENCY DEPARTMENT HISTORY AND PHYSICAL EXAM    Patient Name: [name], [name]  Encounter Date:  12/26/2016  Rendering Provider: Lucille Passy, MD  Patient DOB:  1941/10/02  MRN:  [id number]    History of Presenting Illness     Historian: Pt     76 y.o. female with h/o arthritis, breast cancer, GERD, knee arthroplasty p/w persistent post-op LLE pain starting 12/21/16. Associated with nausea, vomiting, LLE swelling and LLE redness. Pt underwent a knee replacement surgery 5 days PTA. Pt has taken oxycodone and extra strength Tylenol with moderate relief. Denies fevers or chills.     PMD:  Bluford Main, MD    Past Medical History     Past Medical History:   Diagnosis Date   . Arthritis    . Asthma    . Bilateral cataracts     removed both   . Breast cancer     left lumpectomy with radiation tx   . Difficulty walking    . Gastroesophageal reflux disease    . Hypertensive disorder     not on med,   . Seasonal allergies        Past Surgical History     Past Surgical History:   Procedure Laterality Date   . ABCESS DRAINAGE      from buttock   . ARTHROPLASTY, KNEE, TOTAL, COMPUTER NAVIGATION Left 12/21/2016    Procedure: ARTHROPLASTY, KNEE, TOTAL, COMPUTER NAVIGATION;  Surgeon: Cathey Endow, MD;  Location: [facility];  Service: Orthopedics;  Laterality: Left;   . BREAST SURGERY Left 01/2011    well diff CA 0.8 cm Grade 1 (T1) N0MX  ER +   . D&C WITH HYSTEROSCOPY     . EYE SURGERY Left     cataract   . HYSTERECTOMY         Family History     Family History   Problem Relation Age of Onset   . Cancer Other 73     prostate   . Colon cancer Mother 76     Deceased   . Cancer Father        Social History     Social History     Social History   . Marital status: Widowed     Spouse name: N/A   . Number of children: N/A   . Years of education: N/A     Social History Main Topics   . Smoking status: Never Smoker   . Smokeless  tobacco: Never Used   . Alcohol use No   . Drug use: No   . Sexual activity: Not on file     Other Topics Concern   . Not on file     Social History Narrative   . No narrative on file       Home Medications     Home medications reviewed by ED MD     Discharge Medication List as of 12/26/2016  8:51 PM      CONTINUE these medications which have NOT CHANGED    Details   acetaminophen (TYLENOL) 500 MG tablet Take 500 mg by mouth every 6 (six) hours as needed.    , Historical Med      !! aspirin 325 MG EC tablet Take 1 tablet (325 mg total) by mouth 2 (two) times daily., Starting Thu 12/24/2016, No Print      !!  aspirin EC 325 MG EC tablet Take 1 tablet (325 mg total) by mouth 2 (two) times daily.for 28 days, Starting Thu 12/24/2016, Until Thu 01/21/2017, No Print      Calcium Carbonate-Vitamin D (CALTRATE 600+D PO) Take by mouth 2 (two) times daily.    , Historical Med      gabapentin (NEURONTIN) 300 MG capsule Take 1 capsule (300 mg total) by mouth 2 (two) times daily., Starting Thu 12/24/2016, Print      loratadine (CLARITIN) 10 MG tablet Take 10 mg by mouth daily.  , Until Discontinued, Historical Med      montelukast (SINGULAIR) 10 MG tablet Take 10 mg by mouth nightly., Historical Med      oxyCODONE (ROXICODONE) 5 MG immediate release tablet Take 1-2 tablets by mouth every 4-6 hours as needed for pain, Print      ranitidine (ZANTAC) 150 MG tablet Take 150 mg by mouth daily.    , Historical Med      saccharomyces boulardii (FLORASTOR) 250 MG capsule Take 250 mg by mouth daily., Historical Med      senna-docusate (PERICOLACE) 8.6-50 MG per tablet Take 2 tablets by mouth 2 (two) times daily as needed for Constipation., Starting Thu 12/24/2016, No Print       !! - Potential duplicate medications found. Please discuss with provider.          Review of Systems     Constitutional:  No fever, No chills   Eyes:  ENT:   CV:    Resp:    GI: +Nausea, +Vomiting  GU:   MS:  +LLE pain, +LLE swelling,  Skin: +LLE redness   Neuro:     Psych:    All other systems reviewed and negative    Physical Exam     BP 188/77   Pulse 86   Temp 98 F (36.7 C)   Resp 16   Ht 5\' 5"  (1.651 m)   Wt 59 kg   SpO2 97%   BMI 21.63 kg/m       CONSTITUTIONAL   Vital signs reviewed, Patient appears comfortable, Alert and oriented X 3.  HEAD   Atraumatic, Normocephalic.  EYES   Eyes are normal to inspection, Pupils equal, round and reactive to light, No discharge from eyes, Extraocular muscles intact, Sclera are normal, Conjunctiva normal.  ENT Mouth normal to Inspection.  NECK   Normal ROM. No jugular venous distention, No meningeal signs.  RESPIRATORY Chest is nontender, Breath sounds normal, No respiratory distress  CARDIOVASCULAR   RRR, No murmurs, Normal S1 S2, Rhythm is normal.  ABDOMEN   Abdomen is nontender, No masses, Bowel sounds normal, No distension, No peritoneal signs.  BACK  There is no CVA Tenderness, Normal inspection.  UPPER EXTREMITY   Inspection normal, No cyanosis, No edema.  LOWER EXTREMITY   Inspection normal, No cyanosis, No edema.  NEURO   GCS is 15, Speech normal. 5/5 ms and Nl sensation B. No cerebellar deficits. Nl cranial exam  SKIN Skin is warm, Skin is dry, Skin is normal color.  LYMPHATIC   No adenopathy in neck.  PSYCHIATRIC Oriented X 3, Normal affect.    ED Medications Administered     ED Medication Orders     Start Ordered     Status Ordering Provider    12/26/16 1901 12/26/16 1900  acetaminophen (TYLENOL) tablet 1,000 mg  Once     Route: Oral  Ordered Dose: 1,000 mg     Last MAR action:  Given Carliyah Cotterman A    12/26/16 1844 12/26/16 1844  cefTRIAXone (ROCEPHIN) 1 g in sodium chloride 0.9 % 100 mL IVPB mini-bag plus  Once     Route: Intravenous  Ordered Dose: 1 g     Last MAR action:  New Bag Arafat Cocuzza A          Orders Placed During This Encounter     Orders Placed This Encounter   Procedures   . [place] VenoDopp Low Extremity Left   . CBC with differential   . Basic Metabolic Panel   . GFR   . ED Unit Sec Comm Order        Diagnostic Study Results     The results of the diagnostic studies below were reviewed by the ED provider:    Labs  Results     Procedure Component Value Units Date/Time    Basic Metabolic Panel [[id number]]  (Abnormal) Collected:  12/26/16 1757    Specimen:  Blood Updated:  12/26/16 1824     Glucose 102 (H) mg/dL      BUN 17 mg/dL      Creatinine 0.6 mg/dL      Calcium 8.9 mg/dL      Sodium 629 mEq/L      Potassium 4.1 mEq/L      Chloride 106 mEq/L      CO2 24 mEq/L      Anion Gap 11.0    GFR [[id number]] Collected:  12/26/16 1757     Updated:  12/26/16 1824     EGFR >60.0    CBC with differential [[id number]]  (Abnormal) Collected:  12/26/16 1757    Specimen:  Blood from Blood Updated:  12/26/16 1807     WBC 4.78 x10 3/uL      Hgb 11.0 (L) g/dL      Hematocrit 64.4 (L) %      Platelets 192 x10 3/uL      RBC 3.78 (L) x10 6/uL      MCV 87.0 fL      MCH 29.1 pg      MCHC 33.4 g/dL      RDW 14 %      MPV 10.4 fL      Neutrophils 74.9 %      Lymphocytes Automated 13.8 %      Monocytes 8.4 %      Eosinophils Automated 1.9 %      Basophils Automated 0.6 %      Immature Granulocyte 0.4 %      Nucleated RBC 0.0 /100 WBC      Neutrophils Absolute 3.58 x10 3/uL      Abs Lymph Automated 0.66 x10 3/uL      Abs Mono Automated 0.40 x10 3/uL      Abs Eos Automated 0.09 x10 3/uL      Absolute Baso Automated 0.03 x10 3/uL      Absolute Immature Granulocyte 0.02 x10 3/uL      Absolute NRBC 0.00 x10 3/uL           Radiologic Studies  Radiology Results (24 Hour)     Procedure Component Value Units Date/Time    [place] VenoDopp Low Extremity Left [[id number]] Collected:  12/26/16 2036    Order Status:  Completed Updated:  12/26/16 2042    Narrative:       HISTORY: Left leg pain and swelling. History of left knee arthroplasty 5  days ago.    TECHNIQUE: Ultrasound examination of the left  lower extremity veins  performed using grayscale compression ultrasound with the aid of  Doppler.    COMPARISON: None.    FINDINGS:  The left common  femoral, femoral, and popliteal veins are normally  compressible. There are normal Doppler responses in these vessels with  augmentation. The visualized calf veins are patent.    A complex fluid collection is again seen extending from the posterior  distal thigh to the proximal medial calf. This was also seen on the  prior exam and is roughly unchanged in size, measuring up to 6.6 x 1.6 x  3.5 cm.      Impression:         No evidence of deep venous thrombosis in the left lower extremity.    Complex fluid collection along the posterior knee, also seen previously,  which may represent a Baker's cyst or may be related to recent prior  arthroplasty.    Carla Drape, MD   12/26/2016 8:38 PM          Scribe and MD Attestations     I, Lucille Passy, MD, personally performed the services documented. Enrigue Catena is scribing for me on Pflug,Aliegha S. I reviewed and confirm the accuracy of the information in this medical record.    Felipa Eth, am serving as a Neurosurgeon to document services personally performed by Lucille Passy, MD, based on the provider's statements to me.     Credentials: Enrigue Catena, scribe    Rendering Provider: Lucille Passy, MD    Monitors, EKG, Critical Care, and Splints     EKG (interpreted by ED physician): na  Cardiac Monitor (interpreted by ED physician): na    Critical Care:   Splint check:      MDM and Clinical Notes     Notes:  6:55 PM Re-eval: Feeling much better, VS stable, labs essentially within normal,  no acute distress, looks well.     Counseled re dx  Counseled re labs  Counseled re rads  Counseled re follow up  Copies of results given  Answered all questions  Counseled red flags and signs and sxs to return for.  Comfortable with follow up and discharge plan    Signout     Patient signed out to:  Dr. Neita Carp at 8:00 pm     Signout notes:  Pending ultrasound     Consults:  6:55 pm Anise Salvo notes if Margo Aye is ok, pt can make out pt follow up in 2 days.    Diagnosis and  Disposition     Clinical Impression  1. Cellulitis, unspecified cellulitis site        Disposition  ED Disposition     ED Disposition Condition Date/Time Comment    Discharge  Sat Dec 26, 2016  8:51 PM Monesha Monreal Stanly discharge to home/self care.    Condition at disposition: Stable          Prescriptions       Discharge Medication List as of 12/26/2016  8:51 PM      START taking these medications    Details   cephalexin (KEFLEX) 500 MG capsule Take 1 capsule (500 mg total) by mouth 3 (three) times daily.for 7 days, Starting Sat 12/26/2016, Until Sat 01/02/2017, Print                   Rayme Bui, Antony Contras, MD  01/04/17 [phone]

## 2016-12-26 NOTE — ED Provider Notes (Signed)
Physician/Midlevel provider first contact with patient: 12/26/16 1728         ED Signout     Patient received from: Dr. Lourdes Sledge at 8:00PM    Signout notes: Pending u/s doppler      Diagnostic Study Results     The results of the diagnostic studies below were reviewed by the ED provider:    Labs  Results     Procedure Component Value Units Date/Time    Basic Metabolic Panel [[id number]]  (Abnormal) Collected:  12/26/16 1757    Specimen:  Blood Updated:  12/26/16 1824     Glucose 102 (H) mg/dL      BUN 17 mg/dL      Creatinine 0.6 mg/dL      Calcium 8.9 mg/dL      Sodium 403 mEq/L      Potassium 4.1 mEq/L      Chloride 106 mEq/L      CO2 24 mEq/L      Anion Gap 11.0    GFR [[id number]] Collected:  12/26/16 1757     Updated:  12/26/16 1824     EGFR >60.0    CBC with differential [[id number]]  (Abnormal) Collected:  12/26/16 1757    Specimen:  Blood from Blood Updated:  12/26/16 1807     WBC 4.78 x10 3/uL      Hgb 11.0 (L) g/dL      Hematocrit 51.8 (L) %      Platelets 192 x10 3/uL      RBC 3.78 (L) x10 6/uL      MCV 87.0 fL      MCH 29.1 pg      MCHC 33.4 g/dL      RDW 14 %      MPV 10.4 fL      Neutrophils 74.9 %      Lymphocytes Automated 13.8 %      Monocytes 8.4 %      Eosinophils Automated 1.9 %      Basophils Automated 0.6 %      Immature Granulocyte 0.4 %      Nucleated RBC 0.0 /100 WBC      Neutrophils Absolute 3.58 x10 3/uL      Abs Lymph Automated 0.66 x10 3/uL      Abs Mono Automated 0.40 x10 3/uL      Abs Eos Automated 0.09 x10 3/uL      Absolute Baso Automated 0.03 x10 3/uL      Absolute Immature Granulocyte 0.02 x10 3/uL      Absolute NRBC 0.00 x10 3/uL           Radiologic Studies  Radiology Results (24 Hour)     Procedure Component Value Units Date/Time    [place] VenoDopp Low Extremity Left [[id number]] Collected:  12/26/16 2036    Order Status:  Completed Updated:  12/26/16 2042    Narrative:       HISTORY: Left leg pain and swelling. History of left knee arthroplasty 5  days ago.    TECHNIQUE: Ultrasound  examination of the left lower extremity veins  performed using grayscale compression ultrasound with the aid of  Doppler.    COMPARISON: None.    FINDINGS:  The left common femoral, femoral, and popliteal veins are normally  compressible. There are normal Doppler responses in these vessels with  augmentation. The visualized calf veins are patent.    A complex fluid collection is again seen extending from the posterior  distal thigh to the  proximal medial calf. This was also seen on the  prior exam and is roughly unchanged in size, measuring up to 6.6 x 1.6 x  3.5 cm.      Impression:         No evidence of deep venous thrombosis in the left lower extremity.    Complex fluid collection along the posterior knee, also seen previously,  which may represent a Baker's cyst or may be related to recent prior  arthroplasty.    Kelsey Drape, MD   12/26/2016 8:38 PM          Vital Signs  BP 188/77   Pulse 86   Temp 98 F (36.7 C)   Resp 16   Ht 5\' 5"  (1.651 m)   Wt 59 kg   SpO2 97%   BMI 21.63 kg/m   Patient Vitals for the past 24 hrs:   BP Temp Pulse Resp SpO2 Height Weight   12/26/16 1800 188/77 - 86 - 97 % - -   12/26/16 1726 193/70 98 F (36.7 C) 83 16 97 % 5\' 5"  (1.651 m) 59 kg         Scribe and MD Attestations     I have assumed care of this patient: Yes    I, Kelsey Clan, MD, personally performed the services documented.  [name] is scribing for me on Flaum,Kelsey S. I reviewed and confirm the accuracy of the information in this medical record.     I, [name], am serving as a scribe to document services personally performed by Kelsey Clan, MD based on the provider's statements to me.     Rendering Provider: Dr. Neita Giles    Monitors, EKG     Cardiac Monitor (interpreted by ED physician):  N/a    EKG (interpreted by ED physician): N/a      Critical Care     Critical Care Time: No critical care time needed        Clinical Course / MDM     Notes:     8:50PM - Complex fluid collection behind  the knee, maybe a baker's cyst.       Clinical Impression:  1. Cellulitis, unspecified cellulitis site        ED Disposition     ED Disposition Condition Date/Time Comment    Discharge  Sat Dec 26, 2016  8:51 PM [name] discharge to home/self care.    Condition at disposition: Stable             Kelsey Clan, MD  12/28/16 1744

## 2016-12-26 NOTE — ED Notes (Signed)
MD Latif at bedside.

## 2016-12-26 NOTE — ED Notes (Signed)
Pt has been medically cleared for discharge home by Dr. Homero Fellers, pt remains in stable condition, NAD, VSS, PIV removed and bleeding controled. Reviewed all discharge instructions, follow up with Anise Salvo, phone numbers provided, educated on prescription for Keflex.Time allowed to ask questions, no other needs at this time. Pt and friends gathered all personal belongings and ambulated to exit with steady gait using home walker.

## 2016-12-26 NOTE — ED Notes (Signed)
MD at bedside. 

## 2016-12-28 NOTE — Discharge Summary (Signed)
Physician Discharge Summary   Patient ID:  [name]  [id number]  76 y.o.  1941/05/02    Admit date: 12/21/2016    Discharge date and time: 12/24/2016  1:53 PM     Admitting Physician: Cathey Endow, MD     Admission Diagnoses: Left knee osteoarthritis    Discharge Diagnoses: Left knee osteoarthritis    Operative Procedures: Left total knee arthroplasty    Indication for Admission: The patient has a history of left knee osteoarthritis that has become progressively worse and the patient has elected to proceed with left total knee arthroplasty.    Hospital Course: The patient was admitted to the hospital on 12/21/2016 and underwent left total knee arthroplasty. Post-operatively, the patient was transferred to the orthopaedic floor.  They received 24 hours of prophylactic intravenous antibiotics.  DVT prophylaxis included TED hose, Av foot pumps, early ambulation, and aspirin was started on POD #1.      The patient progressed well throughout the hospitalization and was deemed stable for discharge on 12/24/2016 .      Disposition: Home or Self Care    Discharge Medications:    Discharge Medication List as of 12/24/2016  1:25 PM      START taking these medications    Details   !! aspirin 325 MG EC tablet Take 1 tablet (325 mg total) by mouth 2 (two) times daily., Starting Thu 12/24/2016, No Print      !! aspirin EC 325 MG EC tablet Take 1 tablet (325 mg total) by mouth 2 (two) times daily.for 28 days, Starting Thu 12/24/2016, Until Thu 01/21/2017, No Print      gabapentin (NEURONTIN) 300 MG capsule Take 1 capsule (300 mg total) by mouth 2 (two) times daily., Starting Thu 12/24/2016, Print      oxyCODONE (ROXICODONE) 5 MG immediate release tablet Take 1-2 tablets by mouth every 4-6 hours as needed for pain, Print      senna-docusate (PERICOLACE) 8.6-50 MG per tablet Take 2 tablets by mouth 2 (two) times daily as needed for Constipation., Starting Thu 12/24/2016, No Print       !! - Potential duplicate medications found. Please  discuss with provider.      CONTINUE these medications which have NOT CHANGED    Details   acetaminophen (TYLENOL) 500 MG tablet Take 500 mg by mouth every 6 (six) hours as needed.    , Historical Med      Calcium Carbonate-Vitamin D (CALTRATE 600+D PO) Take by mouth 2 (two) times daily.    , Historical Med      loratadine (CLARITIN) 10 MG tablet Take 10 mg by mouth daily.  , Until Discontinued, Historical Med      montelukast (SINGULAIR) 10 MG tablet Take 10 mg by mouth nightly., Historical Med      ranitidine (ZANTAC) 150 MG tablet Take 150 mg by mouth daily.    , Historical Med      saccharomyces boulardii (FLORASTOR) 250 MG capsule Take 250 mg by mouth daily., Historical Med         STOP taking these medications       aspirin 81 MG tablet        GLUCOSAMINE-CHONDROITIN PO        ibuprofen (ADVIL,MOTRIN) 200 MG tablet              Patient Instructions:     The patient will notify me for any increased bleeding, redness, drainage, swelling, worsening pain, calf pain or  swelling, or other concerning symptoms.  They have been instructed to report to the emergency room immediately for any chest pain or shortness of breath.   Physical therapy has been ordered.  Activity:The patient is weight bearing as tolerated on the operative extremity.  Wound Care: The patient has a waterproof dressing in place.  They can shower with the dressing in place.  They will remove the dressing in 1 week. At that time, they can get the incision wet in the shower, and will pat dry afterwards.  Follow-up with Dr. Anise Salvo at scheduled appointment 2 weeks post-op.    Signed:  Cathey Endow  12/28/2016  6:54 AM

## 2017-02-05 ENCOUNTER — Encounter (INDEPENDENT_AMBULATORY_CARE_PROVIDER_SITE_OTHER): Payer: Self-pay

## 2018-01-04 ENCOUNTER — Telehealth: Payer: Medicare Other

## 2018-01-04 NOTE — Pre-Procedure Instructions (Signed)
   Pt called for PSS scheduled interview.  She states not prepared for interview and requests rescheduling though informed would only take 20-30 min.  Liaisons emailed w rescheduling request.

## 2018-01-14 ENCOUNTER — Telehealth: Payer: Medicare Other

## 2018-01-14 NOTE — Pre-Procedure Instructions (Signed)
No tests required per surgeon or guidelines

## 2018-01-19 ENCOUNTER — Ambulatory Visit
Admission: RE | Admit: 2018-01-19 | Discharge: 2018-01-19 | Disposition: A | Discharge status: Home / Self Care | Payer: Medicare Other | Source: Ambulatory Visit | Attending: Gastroenterology | Admitting: Gastroenterology

## 2018-01-19 ENCOUNTER — Ambulatory Visit: Payer: Self-pay

## 2018-01-19 ENCOUNTER — Ambulatory Visit: Payer: Medicare Other | Admitting: Anesthesiology

## 2018-01-19 ENCOUNTER — Encounter
Admission: RE | Disposition: A | Discharge status: Home / Self Care | Payer: Self-pay | Source: Ambulatory Visit | Attending: Gastroenterology

## 2018-01-19 DIAGNOSIS — K2900 Acute gastritis without bleeding: Secondary | ICD-10-CM | POA: Insufficient documentation

## 2018-01-19 DIAGNOSIS — Z800 Family history of malignant neoplasm of digestive organs: Secondary | ICD-10-CM | POA: Insufficient documentation

## 2018-01-19 DIAGNOSIS — Z853 Personal history of malignant neoplasm of breast: Secondary | ICD-10-CM | POA: Insufficient documentation

## 2018-01-19 DIAGNOSIS — K219 Gastro-esophageal reflux disease without esophagitis: Secondary | ICD-10-CM | POA: Insufficient documentation

## 2018-01-19 DIAGNOSIS — Z882 Allergy status to sulfonamides status: Secondary | ICD-10-CM | POA: Insufficient documentation

## 2018-01-19 DIAGNOSIS — Z923 Personal history of irradiation: Secondary | ICD-10-CM | POA: Insufficient documentation

## 2018-01-19 DIAGNOSIS — R1013 Epigastric pain: Secondary | ICD-10-CM | POA: Insufficient documentation

## 2018-01-19 DIAGNOSIS — K449 Diaphragmatic hernia without obstruction or gangrene: Secondary | ICD-10-CM | POA: Insufficient documentation

## 2018-01-19 DIAGNOSIS — Z880 Allergy status to penicillin: Secondary | ICD-10-CM | POA: Insufficient documentation

## 2018-01-19 DIAGNOSIS — Z7982 Long term (current) use of aspirin: Secondary | ICD-10-CM | POA: Insufficient documentation

## 2018-01-19 DIAGNOSIS — B3781 Candidal esophagitis: Secondary | ICD-10-CM | POA: Insufficient documentation

## 2018-01-19 HISTORY — PX: EGD, COLONOSCOPY: SHX3799

## 2018-01-19 HISTORY — DX: Essential (primary) hypertension: I10

## 2018-01-19 HISTORY — DX: Low back pain, unspecified: M54.50

## 2018-01-19 SURGERY — EGD, COLONOSCOPY
Anesthesia: Anesthesia General

## 2018-01-19 MED ORDER — LACTATED RINGERS IV SOLN
INTRAVENOUS | Status: DC
Start: 2018-01-19 — End: 2018-01-19

## 2018-01-19 MED ORDER — PROPOFOL 10 MG/ML IV EMUL (WRAP)
INTRAVENOUS | Status: DC | PRN
Start: 2018-01-19 — End: 2018-01-19
  Administered 2018-01-19: 30 mg via INTRAVENOUS
  Administered 2018-01-19: 50 mg via INTRAVENOUS
  Administered 2018-01-19: 30 mg via INTRAVENOUS
  Administered 2018-01-19: 20 mg via INTRAVENOUS
  Administered 2018-01-19 (×2): 30 mg via INTRAVENOUS
  Administered 2018-01-19: 20 mg via INTRAVENOUS
  Administered 2018-01-19: 30 mg via INTRAVENOUS
  Administered 2018-01-19: 50 mg via INTRAVENOUS
  Administered 2018-01-19: 30 mg via INTRAVENOUS
  Administered 2018-01-19 (×3): 20 mg via INTRAVENOUS
  Administered 2018-01-19: 40 mg via INTRAVENOUS
  Administered 2018-01-19: 30 mg via INTRAVENOUS

## 2018-01-19 SURGICAL SUPPLY — 37 items
BASIN EMESIS ROSE (Patient Supply) ×2 IMPLANT
BITE BLOCK MAXI 60F LATEX FREE (Procedure Accessories) ×1
BLOCK BITE OD60 FR STURDY STRAP SIDEPORT (Procedure Accessories) ×1 IMPLANT
BLOCK BITE OD60 FR STURDY STRAP SIDEPORT DENTAL RETENTION RIM MAXI (Procedure Accessories) ×1 IMPLANT
BRUSH CYTOLOGY 230CM SHTH BRSTL PSTV RING 3MM 2.1MM CONMED COLONOSCOPE (Brushes) ×1 IMPLANT
BRUSH CYTOLOGY DISP 3MMX230CM (Brushes) ×1
BRUSH CYTOLOGY L230 CM SHEATH BRISTLE (Brushes) ×1 IMPLANT
CANISTER SUCTION 1500 CC LINE (Suction) ×1
CANISTER SUCTION 1500 CC SEMIRIGID 1 (Suction) ×1 IMPLANT
CANISTER SUCTION 1500 CC SEMIRIGID 1 ELBOW FILTER SELF ALIGN LID (Suction) ×1 IMPLANT
CAP BIOPSY (Procedure Accessories) ×1
CONTAINER HISTOLOGY 60 ML 30 ML GRADUATE LEAK RESISTANT O RING PREFILL (Procedure Accessories) ×4 IMPLANT
DEVICE ENDOSCOPIC RAPID EXCHANGE BIOPSY (Procedure Accessories) ×1 IMPLANT
DEVICE ENDOSCOPIC RAPID EXCHANGE BIOPSY CAP OLYMPUS (Procedure Accessories) ×1 IMPLANT
FORCEPS BIOPSY L240 CM MICROMESH TEETH STREAMLINE CATHETER NEEDLE (Instrument) ×1 IMPLANT
FORCEPS BIOPSY L240 CM STANDARD CAPACITY (Instrument) ×1 IMPLANT
FORCEPS RADIAL JAW 4 2.8MM (Instrument) ×1
GLOVE EXAM LARGE NITRILE CHEMOTHERAPY POWDER FREE SENSE OATMEAL (Glove) ×1 IMPLANT
GLOVE EXAM NITRILE RESTORE LG (Glove) ×1 IMPLANT
GLV EXAM NITRILE RESTORE LG (Glove) ×2
GOWN CP ELSTC WRIST REG/LG BL (Gown) ×1
GOWN ISL PP PE REG LG LF FULL BCK NK TIE (Gown) ×1 IMPLANT
GOWN ISOLATION REGULAR LARGE FULL BACK NECK TIE ELASTIC CUFF (Gown) ×1 IMPLANT
JELLY LUB EZ LF STRL H2O SOL NGRS TRNLU (Irrigation Solutions) ×1 IMPLANT
JELLY LUBE STRL FLPTOP 4OZ (Irrigation Solutions) ×1
SOL FORMALIN 10% PREFILL 30ML (Procedure Accessories) ×8
SPONGE GAUZE L4 IN X W4 IN 4 PLY HIGH (Sponge) ×1 IMPLANT
SPONGE GAUZE L4 IN X W4 IN 4 PLY NONWOVEN LINT FREE CURITY RAYON (Sponge) ×1 IMPLANT
SPONGE GAUZE VERSA 4PLY 4X4 (Sponge) ×1
STERILE WATER 1000ML (Solution) ×1
SYRINGE 50 ML GRADUATE NONPYROGENIC DEHP (Syringes, Needles) ×1 IMPLANT
SYRINGE 50 ML GRADUATE NONPYROGENIC DEHP FREE PVC FREE BD MEDICAL (Syringes, Needles) ×1 IMPLANT
SYRINGE SLIP-TIP 60CC (Syringes, Needles) ×1
TRAP PT20 SPECIMEN COLECTN SGL (Other) ×1
TRAP SUCTION SPECIMEN POLYP STONE (Other) ×1 IMPLANT
TUBING ENDOSCOPY EXTENSION (Endoscopic Supplies) ×2 IMPLANT
WATER STERILE PVC FREE DEHP FREE 1000 ML (Solution) ×1 IMPLANT

## 2018-01-19 NOTE — Progress Notes (Signed)
Discharge instructions discussed and instructions given to patient to take home. All questions answered. Pt tolerating liquids well po.

## 2018-01-19 NOTE — Anesthesia Postprocedure Evaluation (Signed)
Anesthesia Post Evaluation    Patient: [name]    Procedure(s) with comments:  EGD, COLONOSCOPY - EGD, COLONOSCOPY W/IVA    Anesthesia type: general    Last Vitals:   Vitals:    01/19/18 1141   BP: 137/62   Pulse: 72   Resp:    Temp:    SpO2: 100%       Anesthesia Post Evaluation:                   Anesthetic complications: No          Cardiovascular status: acceptable  Respiratory status: acceptable  Hydration status: acceptable        Anesthesia Qualified Clinical Data Registry 2018    PACU Reintubation  Did the Patient have general anesthesia with intubation: No        PONV Adult  Is the patient aged 47 or older: Yes  Did the patient receive recieve a general anesthestic: Yes          PONV Pediatric  Is the patient aged 40-17? No            PACU Transfer Checklist Protocol  Was the patient transferred to the PACU at the conclusion of surgery? Yes  Was a checklist or transfer protocol used? Yes    ICU Transfer Checklist Protocol  Was the patient transferred to the ICU at the conclusion of surgery? No      Post-op Pain Assessment Prior to Anesthesia Care End          Perioperative Mortality  Perioperative mortality prior to Anesthesia end time: No    Perioperative Cardiac Arrest  Did the patient have an unanticipated intraoperative cardiac arrest between anesthesia start time and anesthesia end time? No    Unplanned Admission to ICU  Did the patient have an unplanned admission to the ICU (not initially anticipated at anesthesia start time)? No      Signed by: Glenna Fellows, 01/19/2018 12:13 PM

## 2018-01-19 NOTE — Transfer of Care (Signed)
Anesthesia Transfer of Care Note    Patient: [name]    Procedures performed: Procedure(s) with comments:  EGD, COLONOSCOPY - EGD, COLONOSCOPY W/IVA    Anesthesia type: General TIVA    Patient location:Phase I PACU    Last vitals:   Vitals:    01/19/18 0941   BP: (!) 194/93   Pulse: 83   Resp: 20   Temp: 36.4 C (97.6 F)   SpO2: 98%       Post pain: Patient not complaining of pain, continue current therapy      Mental Status:sedated    Respiratory Function: tolerating room air    Cardiovascular: stable    Nausea/Vomiting: patient not complaining of nausea or vomiting    Hydration Status: adequate    Post assessment: no apparent anesthetic complications    Signed by: Glenna Fellows  01/19/18 11:16 AM

## 2018-01-19 NOTE — Progress Notes (Signed)
Dr. Kern Alberta spoke to patient at bedside. Son also at bedside at this time.

## 2018-01-19 NOTE — H&P (Signed)
2GI PRE PROCEDURE NOTE    Proceduralist Comments:   Review of Systems and Past Medical / Surgical History performed: Yes     Indications:Dyspepsia and Family history of colon cancer    Previous Adverse Reaction to Anesthesia or Sedation (if yes, describe): No    Physical Exam / Laboratory Data (If applicable)   General: Alert and cooperative  Lungs: Lungs clear to auscultation  Cardiac: RRR, normal S1S2.    Abdomen: Soft, non tender. Normal active bowel sounds  Other:     No labs drawn    American Society of Anesthesiologists (ASA) Physical Status Classification:   Anesthesia ASA Score: 2          Planned Sedation:   Deep sedation with anesthesia    Attestation:   Audreana S Arenz has been reassessed immediately prior to the procedure and is an appropriate candidate for the planned sedation and procedure. Risks, benefits and alternatives to the planned procedure and sedation have been explained to the patient or guardian:  yes        Signed by: Caleb Popp

## 2018-01-19 NOTE — Anesthesia Preprocedure Evaluation (Signed)
Anesthesia Evaluation    AIRWAY    Mallampati: III    TM distance: >3 FB  Neck ROM: full  Mouth Opening:full   CARDIOVASCULAR    cardiovascular exam normal       DENTAL        (+) implants   PULMONARY    pulmonary exam normal     OTHER FINDINGS                  Relevant Problems   No relevant active problems       PSS Anesthesia Comments: No problems with anesthesia  No family history of anesthesia problems        Anesthesia Plan    ASA 2     general                     intravenous induction   Detailed anesthesia plan: general IV        Post op pain management: per surgeon    informed consent obtained      pertinent labs reviewed             Signed by: Glenna Fellows 01/19/18 10:24 AM

## 2018-01-20 ENCOUNTER — Encounter: Payer: Self-pay | Admitting: Gastroenterology

## 2018-01-20 LAB — LAB USE ONLY - HISTORICAL SURGICAL PATHOLOGY

## 2018-01-21 LAB — LAB USE ONLY - HISTORICAL NON-GYN MEDICAL CYTOLOGY

## 2018-02-07 ENCOUNTER — Other Ambulatory Visit: Payer: Self-pay | Admitting: Obstetrics & Gynecology

## 2018-04-29 ENCOUNTER — Other Ambulatory Visit: Payer: Self-pay | Admitting: Gastroenterology

## 2018-08-19 ENCOUNTER — Other Ambulatory Visit: Payer: Self-pay | Admitting: Physician Assistant

## 2018-11-10 ENCOUNTER — Other Ambulatory Visit (INDEPENDENT_AMBULATORY_CARE_PROVIDER_SITE_OTHER): Payer: Self-pay

## 2018-11-14 ENCOUNTER — Other Ambulatory Visit: Payer: Self-pay | Admitting: Internal Medicine

## 2018-11-14 DIAGNOSIS — R0600 Dyspnea, unspecified: Secondary | ICD-10-CM

## 2018-11-14 DIAGNOSIS — R0609 Other forms of dyspnea: Secondary | ICD-10-CM

## 2018-11-15 ENCOUNTER — Other Ambulatory Visit: Payer: Self-pay | Admitting: Internal Medicine

## 2018-11-15 DIAGNOSIS — R0609 Other forms of dyspnea: Secondary | ICD-10-CM

## 2018-11-15 DIAGNOSIS — R079 Chest pain, unspecified: Secondary | ICD-10-CM

## 2018-11-16 ENCOUNTER — Ambulatory Visit (INDEPENDENT_AMBULATORY_CARE_PROVIDER_SITE_OTHER): Payer: Self-pay | Admitting: Cardiology

## 2018-11-16 ENCOUNTER — Inpatient Hospital Stay
Admission: RE | Admit: 2018-11-16 | Discharge: 2018-11-16 | Disposition: A | Discharge status: Home / Self Care | Payer: Medicare Other | Source: Ambulatory Visit | Attending: Internal Medicine | Admitting: Internal Medicine

## 2018-11-16 DIAGNOSIS — I209 Angina pectoris, unspecified: Secondary | ICD-10-CM | POA: Insufficient documentation

## 2018-11-16 DIAGNOSIS — R0789 Other chest pain: Secondary | ICD-10-CM | POA: Insufficient documentation

## 2018-11-16 DIAGNOSIS — R0609 Other forms of dyspnea: Secondary | ICD-10-CM | POA: Insufficient documentation

## 2018-11-16 DIAGNOSIS — R079 Chest pain, unspecified: Secondary | ICD-10-CM

## 2018-11-16 DIAGNOSIS — R0600 Dyspnea, unspecified: Secondary | ICD-10-CM

## 2018-11-16 MED ORDER — TECHNETIUM TC 99M TETROFOSMIN IV KIT
10.0000 | PACK | Freq: Once | INTRAVENOUS | Status: AC | PRN
Start: 2018-11-16 — End: 2018-11-16
  Administered 2018-11-16: 11:00:00 10 via INTRAVENOUS
  Filled 2018-11-16: qty 100

## 2018-11-16 MED ORDER — TECHNETIUM TC 99M TETROFOSMIN IV KIT
35.0000 | PACK | Freq: Once | INTRAVENOUS | Status: AC | PRN
Start: 2018-11-16 — End: 2018-11-16
  Administered 2018-11-16: 12:00:00 35 via INTRAVENOUS
  Filled 2018-11-16: qty 100

## 2018-12-09 ENCOUNTER — Other Ambulatory Visit: Payer: Self-pay | Admitting: Internal Medicine

## 2019-02-23 ENCOUNTER — Other Ambulatory Visit: Payer: Self-pay | Admitting: Internal Medicine

## 2019-04-06 ENCOUNTER — Other Ambulatory Visit: Payer: Self-pay | Admitting: Obstetrics & Gynecology

## 2019-05-09 ENCOUNTER — Other Ambulatory Visit: Payer: Self-pay | Admitting: Internal Medicine

## 2019-08-08 ENCOUNTER — Other Ambulatory Visit: Payer: Self-pay | Admitting: Body Imaging

## 2019-08-28 ENCOUNTER — Other Ambulatory Visit: Payer: Self-pay | Admitting: Internal Medicine

## 2019-11-17 ENCOUNTER — Ambulatory Visit (FREE_STANDING_LABORATORY_FACILITY): Payer: Medicare Other

## 2019-11-17 DIAGNOSIS — Z01818 Encounter for other preprocedural examination: Secondary | ICD-10-CM

## 2019-11-17 DIAGNOSIS — Z1159 Encounter for screening for other viral diseases: Secondary | ICD-10-CM

## 2019-11-18 LAB — COVID-19 (SARS-COV-2): SARS CoV 2 Overall Result: NOT DETECTED

## 2019-11-20 ENCOUNTER — Encounter (FREE_STANDING_LABORATORY_FACILITY): Payer: Medicare Other

## 2019-11-20 ENCOUNTER — Ambulatory Visit: Payer: Self-pay

## 2019-11-20 DIAGNOSIS — R140 Abdominal distension (gaseous): Secondary | ICD-10-CM

## 2019-11-20 DIAGNOSIS — K449 Diaphragmatic hernia without obstruction or gangrene: Secondary | ICD-10-CM

## 2019-11-20 DIAGNOSIS — K2270 Barrett's esophagus without dysplasia: Secondary | ICD-10-CM

## 2019-11-20 DIAGNOSIS — K30 Functional dyspepsia: Secondary | ICD-10-CM

## 2019-11-20 DIAGNOSIS — K3189 Other diseases of stomach and duodenum: Secondary | ICD-10-CM

## 2019-11-20 LAB — SURGICAL PATHOLOGY EXAM

## 2019-11-22 LAB — LAB USE ONLY - HISTORICAL SURGICAL PATHOLOGY

## 2020-02-06 ENCOUNTER — Other Ambulatory Visit: Payer: Self-pay | Admitting: Obstetrics & Gynecology

## 2020-05-08 ENCOUNTER — Ambulatory Visit
Admission: RE | Admit: 2020-05-08 | Discharge: 2020-05-08 | Disposition: A | Discharge status: Home / Self Care | Payer: Medicare Other | Source: Ambulatory Visit | Attending: Physical Medicine & Rehabilitation | Admitting: Physical Medicine & Rehabilitation

## 2020-05-08 ENCOUNTER — Other Ambulatory Visit: Payer: Self-pay | Admitting: Physical Medicine & Rehabilitation

## 2020-05-08 DIAGNOSIS — M25561 Pain in right knee: Secondary | ICD-10-CM

## 2020-05-08 DIAGNOSIS — M23351 Other meniscus derangements, posterior horn of lateral meniscus, right knee: Secondary | ICD-10-CM | POA: Insufficient documentation

## 2020-05-08 DIAGNOSIS — M1710 Unilateral primary osteoarthritis, unspecified knee: Secondary | ICD-10-CM

## 2020-05-08 DIAGNOSIS — M238X1 Other internal derangements of right knee: Secondary | ICD-10-CM | POA: Insufficient documentation

## 2020-05-08 DIAGNOSIS — M23331 Other meniscus derangements, other medial meniscus, right knee: Secondary | ICD-10-CM | POA: Insufficient documentation

## 2020-05-08 DIAGNOSIS — M23341 Other meniscus derangements, anterior horn of lateral meniscus, right knee: Secondary | ICD-10-CM | POA: Insufficient documentation

## 2020-05-08 DIAGNOSIS — M7121 Synovial cyst of popliteal space [Baker], right knee: Secondary | ICD-10-CM | POA: Insufficient documentation

## 2020-05-08 DIAGNOSIS — R600 Localized edema: Secondary | ICD-10-CM | POA: Insufficient documentation

## 2020-05-08 DIAGNOSIS — M65861 Other synovitis and tenosynovitis, right lower leg: Secondary | ICD-10-CM | POA: Insufficient documentation

## 2020-05-08 DIAGNOSIS — M1711 Unilateral primary osteoarthritis, right knee: Secondary | ICD-10-CM | POA: Insufficient documentation

## 2020-05-08 DIAGNOSIS — M25761 Osteophyte, right knee: Secondary | ICD-10-CM | POA: Insufficient documentation

## 2020-05-08 DIAGNOSIS — M25461 Effusion, right knee: Secondary | ICD-10-CM | POA: Insufficient documentation

## 2020-05-08 DIAGNOSIS — M23361 Other meniscus derangements, other lateral meniscus, right knee: Secondary | ICD-10-CM | POA: Insufficient documentation

## 2020-07-30 DIAGNOSIS — Z789 Other specified health status: Secondary | ICD-10-CM

## 2020-07-30 HISTORY — DX: Other specified health status: Z78.9

## 2020-08-06 NOTE — Pre-Procedure Instructions (Signed)
PEC Triage Case Review: yes  PEC appointment requested in EPIC through in person scheduling button.   Criteria for appointment- Reeves    PSS Interview RN to make PEC appt if not done by scheduling prior to interview.

## 2020-08-09 ENCOUNTER — Encounter: Payer: Self-pay | Admitting: Orthopaedic Surgery

## 2020-08-09 ENCOUNTER — Ambulatory Visit: Payer: Medicare Other | Attending: Orthopaedic Surgery

## 2020-08-09 ENCOUNTER — Ambulatory Visit: Payer: Medicare Other

## 2020-08-09 NOTE — Pre-Procedure Instructions (Signed)
-  Surgical Risk level:   Intermediate risk  -Surgeon testing requirements:   CBC, BMP, PT?PTT. MRSA, EKG, medical clearance.  -Anesthesia guideline requirements:   PEC scheduled 11/2.  -Special notes/ Test results/ Record requested:   Asthma- controlled with inhalor-Req/ Pulmonary LON from 10/18 note.  -Recent hospitalization / ER visit :   NA  -Future plan / Upcoming appts:   NA  -Labs/Testing @[facility]  PSS:   NA  -E.mail sent to:   NA  -Faxes sent to:   Orders faxed to pharmacy   Faxed Pulmonary Dr. Gaynelle Arabian Phone: [phone]; Fax: [phone] for LON from yesterday.    -Epic orders entered:Phone:-Other information:   IV. LR. Foot pump, T&S  -Chart room handoff for further follow-up:   Pulmonary LON    NPO Instructions given to patient:   ? NPO instructions reviewed: Clear liquids up to 2 hours prior to arrival time, then NPO. No solid food 8 hours prior to scheduled procedure time. Examples of clear liquids include water, apple juice, sports drinks such as Gatorade, coffee or tea without milk or cream. Sugar or sweetener may be added  ? Fasting Requirements per Preoperative Fasting Guidelines for Elective Surgeries and Procedures Requiring Anesthesia Policy Revised 02/2020.  Ingested material Fasting requirement   Clear liquids/Ice Chips 2 hours prior to arrival time   Breast milk 4 hours prior to scheduled procedure time   Infant formula 6 hours prior to scheduled procedure time   Non-human milk 8 hours prior to scheduled procedure time   Solid food 8 hours prior to scheduled procedure time   Visitor Restriction Guidelines per [facility] as of 04/29/20:  All visitors must adhere to the following:    Exhibit no COVID-19 symptoms    Age 35+ (internally exceptions can be made by administrative team as needed, e.g., siblings, end of life situations, etc.)    In keeping with the CDCs current guidance, regardless of vaccination status, everyone in a healthcare facility must wear a mask covering their mouth  and nose the entire time they are in the facility. Visitors who fail to wear a mask properly will be asked to leave.    The following face coverings cannot be worn at any [place] location: gaiter style masks, bandanas or vented masks.    No visitors are allowed for patients with suspected or confirmed COVID-19, except in end-of-life situations.  Hospital Inpatient:   Visitation hours: 9 a.m. - 6:30 p.m. daily    Adult patients may have two visitors, in addition to a [employer] Person (DSP), if applicable    Pediatric patients may have two parents/guardians at bedside 24/7. For pediatric outpatient areas, only parents/guardians may visit  Outpatient/Ambulatory Surgery:    Adult patients may have one visitor, in addition to their Designated Support Person (DSP) on the day of surgery.    No family members will be allowed into Phase 1 recovery areas. Physicians will call contact person to give report of procedure.    Family / visitor will be called by PACU staff to review discharge instructions via phone and answer any questions.   Advise to call surgeon if need to cancel surgery arises.

## 2020-08-13 ENCOUNTER — Ambulatory Visit
Admission: RE | Admit: 2020-08-13 | Discharge: 2020-08-13 | Disposition: A | Discharge status: Home / Self Care | Payer: Medicare Other | Source: Ambulatory Visit

## 2020-08-13 ENCOUNTER — Encounter: Payer: Self-pay | Admitting: Acute Care

## 2020-08-13 ENCOUNTER — Ambulatory Visit
Admission: RE | Admit: 2020-08-13 | Discharge: 2020-08-13 | Disposition: A | Discharge status: Home / Self Care | Payer: Medicare Other | Source: Ambulatory Visit | Attending: Adult Health | Admitting: Adult Health

## 2020-08-13 ENCOUNTER — Encounter: Payer: Self-pay | Admitting: Adult Health

## 2020-08-13 ENCOUNTER — Ambulatory Visit: Payer: Medicare Other | Attending: Adult Health

## 2020-08-13 DIAGNOSIS — J4520 Mild intermittent asthma, uncomplicated: Secondary | ICD-10-CM

## 2020-08-13 DIAGNOSIS — R112 Nausea with vomiting, unspecified: Secondary | ICD-10-CM

## 2020-08-13 DIAGNOSIS — M1711 Unilateral primary osteoarthritis, right knee: Secondary | ICD-10-CM | POA: Insufficient documentation

## 2020-08-13 DIAGNOSIS — Z01818 Encounter for other preprocedural examination: Secondary | ICD-10-CM

## 2020-08-13 DIAGNOSIS — F419 Anxiety disorder, unspecified: Secondary | ICD-10-CM

## 2020-08-13 DIAGNOSIS — K219 Gastro-esophageal reflux disease without esophagitis: Secondary | ICD-10-CM

## 2020-08-13 DIAGNOSIS — I10 Essential (primary) hypertension: Secondary | ICD-10-CM

## 2020-08-13 DIAGNOSIS — Z9889 Other specified postprocedural states: Secondary | ICD-10-CM

## 2020-08-13 LAB — URINALYSIS
Bilirubin, UA: NEGATIVE
Blood, UA: NEGATIVE
Glucose, UA: NEGATIVE
Ketones UA: NEGATIVE
Leukocyte Esterase, UA: NEGATIVE
Nitrite, UA: NEGATIVE
Protein, UR: NEGATIVE
Specific Gravity UA: 1.009 (ref 1.001–1.035)
Urine pH: 6.5 (ref 5.0–8.0)
Urobilinogen, UA: 0.2 (ref 0.2–2.0)

## 2020-08-13 NOTE — H&P (Signed)
[name], 79 y.o., female, presents to the [facility] clinic for a pre-operative evaluation.    Diagnosis: Primary osteoarthritis of one knee, right [M17.11]  Patient is scheduled for: Procedure(s) with comments:  ARTHROPLASTY, KNEE, TOTAL, COMPUTER NAVIGATION - RIGHT ARTHROPLASTY, KNEE, TOTAL, NAVIGATION  ASST=Y  **[employer] re-ntfd. All Reps 08-12-20@755am  for 08-19-20@1015 -PI (C/L)  **Re-ntfd. time change-PI  Currently scheduled for 08/19/2020  0955   with Cathey Endow, MD     HPI: Preoperative evaluation                   Patient Active Problem List    Diagnosis Date Noted    Post-operative nausea and vomiting     Hypertension      controlled with med. Checks BP at home   BP elevated in clinic. 203/80 states she very anxious.   PCP prescribed xanax PRN, okay to take prior to surgery  08/15/20- call received from pt, status update re BP  Took BP this am 150/72.        Asthma      Fairly controlled on Breo  Reports mild shortnessof breath with exertion  Followed by pulmonology, LOV 08/08/20 for pulm clearance        Gastroesophageal reflux disease      mild intermittent      Anxiety      situational, PCP provided xanax x 3 nights      Primary osteoarthritis of right knee      c/o right knee pain (dull ache), causing restrictive moment & affecting activities. Tried PT & injections with temporary relief. Indication for surgery         Objective Data:    Height 1.626 m (5\' 4" ), weight 59.4 kg (131 lb). BMI 22.29  Vital signs 203/80 P-65 O2 sat 97%        LABS 10/26/202: CMP, PT/INR,aPTT wdl, CBC H/H 14,1/43.3, PLT176;MRSA (Nares) negative    Results for orders placed or performed during the hospital encounter of 08/13/20   Presurgical Surveillance, MSSA+MRSA    Specimen: Throat   Result Value Ref Range    Culture Staph and MRSA Surveillance       No Staph aureus isolated and  No Methicillin Resistant Staph aureus isolated     Urinalysis   Result Value Ref Range    Urine Type Clean Catch     Color, UA YELLOW  Colorless - Yellow    Clarity, UA CLEAR Clear - Hazy    Specific Gravity UA 1.009 1.001 - 1.035    Urine pH 6.5 5.0 - 8.0    Leukocyte Esterase, UA NEGATIVE Negative    Nitrite, UA NEGATIVE Negative    Protein, UR NEGATIVE Negative    Glucose, UA NEGATIVE Negative    Ketones UA NEGATIVE Negative    Urobilinogen, UA 0.2 0.2 - 2.0    Bilirubin, UA NEGATIVE Negative    Blood, UA NEGATIVE Negative       ECHO:  No results found for this or any previous visit.    NM  Myocardial Perfusion Spect 11/17/2018:    MRI-Right Knee 05/09/2020:  HISTORY: Right knee pain and swelling since March 2020.  COMPARISON: None.  TECHNIQUE: Magnetic resonance imaging of the right knee performed on a 3T scanner according to standard protocol without contrast.     FINDINGS:  Menisci: There is a complex tear of the lateral meniscus, including complex tearing and degeneration of the anterior horn extending into the anterior root attachment, complex tearing  of the body with extrusion and  displacement of meniscus tissue within the meniscofemoral recess, and  degeneration and free edge tearing of the posterior horn. There is a  degenerative horizontal undersurface and free edge tear of the medial  meniscus posterior horn and body.    Ligaments: There is mild to moderate ACL degeneration. The PCL is intact. The collateral ligaments are intact.    Muscles/Tendons: The extensor mechanism is intact. There is moderate  tendinosis/degeneration of the distal semimembranosus tendon. There is mild fatty infiltration of the distal semimembranosus muscle.    Cartilage:  Patellofemoral Compartment: There is full-thickness cartilage loss  throughout the majority of the medial patellar facet and median patellar  ridge. There is near full-thickness to full-thickness cartilage loss  involving the majority of the medial femoral trochlea.  Medial Compartment: There is mild cartilage degeneration involving the  medial femoral condyle.  Lateral Compartment: There  is full-thickness cartilage loss throughout the posterior weightbearing and nonweightbearing surfaces of the lateral  femoral condyle with prominent adjacent subchondral marrow edema andsclerosis. There is full-thickness cartilage loss throughout the  lateral/outer half of the opposing lateral tibial plateau with prominent  adjacent subchondral marrow edema, sclerosis, and cystic changes.  Partial-thickness cartilage loss extends into the high posterior  nonweightbearing surface of the lateral femoral condyle. There is cartilage thinning, degeneration, and fissuring scattered throughout the remainder of the lateral compartment.    Bone: There is tricompartmental marginal osteophyte formation, most  prominently involving the lateral compartment. The bones and bone marrow are otherwise normal. No definite fracture is identified.    Joint: There is a large knee joint effusion with extensive synovitis,  numerous round/ovoid foci of synovitis and/or intra-articular bodies within  the suprapatellar and posterior joint recesses, and a large Baker's cyst  with mild leakage inferiorly.    Other: There is mild to moderate nonspecific subcutaneous edema about the knee.    IMPRESSION:   1.  Complex tear of the lateral meniscus, most prominently involving the  anterior horn and body, as detailed in the findings.    2.  Degenerative horizontal undersurface and free edge tear of the  medial meniscus posterior horn and body.    3.  Tricompartmental degenerative changes, as detailed in the findings,  most pronounced within the lateral compartment where there are large areas of full-thickness cartilage loss involving the lateral femoral condyle and opposing lateral tibial plateau with adjacent subchondral marrow edema, cystic changes, and sclerosis.    4.  Mild to moderate ACL degeneration.    5.  Large knee joint effusion with extensive synovitis, numerous  round/ovoid foci of synovitis and/or intra-articular bodies within  the  suprapatellar and posterior joint recesses, and a large Baker's cyst with  mild leakage inferiorly.    6.  Additional findings as discussed above.    Electronically signed by: Vassie Moment M.D.  [Interpreted at: 01]  [facility]    Medical Clearance 08/07/2020: Dr. Charlesetta Garibaldi      Pulmonary LOV Note: 04/19/2020: Dr. Elmer Sow    Anesthesia History: No  known family history of anesthesia complications (+) PONV    Past Medical Hx:   Past Medical History:   Diagnosis Date    Anxiety     situational, PCP provided xanax x 3 nights    Arthritis     right knee, hips, lower back     Asthma     well controlled    Breast CA 06/20/2012  Breast cancer 2012    left lumpectomy with radiation tx    Gastroesophageal reflux disease     mild intermittent    Has immunity to COVID-19 virus 07/30/2020    Pfizer x3    Hx of breast cancer 05/01/2014    Left 1 cm grade 1, ER positive, SLN negative     Hypertension     controlled with med.    Low back pain     Post-operative nausea and vomiting     Primary osteoarthritis of one knee, left 12/21/2016    Primary osteoarthritis of right knee     c/o right knee pain (dull ache), causing restrictive moment & affecting activities. Tried PT & injections with temporary relief. Indication for surgery    Seasonal allergies      Past Surgical Hx:   Past Surgical History:   Procedure Laterality Date    ABCESS DRAINAGE      from buttock    ARTHROPLASTY, KNEE, TOTAL, COMPUTER NAVIGATION Left 12/21/2016    Procedure: ARTHROPLASTY, KNEE, TOTAL, COMPUTER NAVIGATION;  Surgeon: Cathey Endow, MD;  Location: [facility];  Service: Orthopedics;  Laterality: Left;    BREAST SURGERY Left 01/2011    well diff CA 0.8 cm Grade 1 (T1) N0MX  ER +    D&C WITH HYSTEROSCOPY      EGD, COLONOSCOPY N/A 01/19/2018    Procedure: EGD, COLONOSCOPY;  Surgeon: Caleb Popp, MD;  Location: [id number];  Service: Gastroenterology;  Laterality: N/A;  EGD, COLONOSCOPY W/IVA     EYE SURGERY Left     cataract    HYSTERECTOMY  1992    JOINT REPLACEMENT         Family Hx:   Family History   Problem Relation Age of Onset    Cancer Other 2        prostate    Colon cancer Mother 74        Deceased    Cancer Father      Social Hx:   Social History     Socioeconomic History    Marital status: Widowed     Spouse name: None    Number of children: None    Years of education: None    Highest education level: None   Occupational History    None   Tobacco Use    Smoking status: Never Smoker    Smokeless tobacco: Never Used   Vaping Use    Vaping Use: Never used   Substance and Sexual Activity    Alcohol use: No    Drug use: No    Sexual activity: None   Other Topics Concern    None   Social History Narrative    None     Social Determinants of Health     Financial Resource Strain:     Difficulty of Paying Living Expenses: Not on file   Food Insecurity:     Worried About Programme researcher, broadcasting/film/video in the Last Year: Not on file    [employer] of Food in the Last Year: Not on file   Transportation Needs:     Lack of Transportation (Medical): Not on file    Lack of Transportation (Non-Medical): Not on file   Physical Activity:     Days of Exercise per Week: Not on file    Minutes of Exercise per Session: Not on file   Stress:     Feeling of Stress : Not on file  Social Connections:     Frequency of Communication with Friends and Family: Not on file    Frequency of Social Gatherings with Friends and Family: Not on file    Attends Religious Services: Not on Marketing executive or Organizations: Not on file    Attends Banker Meetings: Not on file    Marital Status: Not on file   Intimate Partner Violence:     Fear of Current or Ex-Partner: Not on file    Emotionally Abused: Not on file    Physically Abused: Not on file    Sexually Abused: Not on file   Housing Stability:     Unable to Pay for Housing in the Last Year: Not on file    Number of Places Lived  in the Last Year: Not on file    Unstable Housing in the Last Year: Not on file       Allergies:   Allergies   Allergen Reactions    Erythromycin Itching and Rash    Morphine Itching     Morphine causes severe itching "bugs crawling on skin"    Pt can tolerate oxycodone    Penicillins Rash     Pt states she has taken Keflex before without any problems    Tetracycline Rash    Codeine Other (See Comments)     unknown    Wound Dressing Adhesive Other (See Comments)     contrindicated d/t thin skin    Contrast [Iodinated Diagnostic Agents] Itching and Rash     Able to eat shellfish    Levofloxacin Rash    Naprosyn [Naproxen] Rash    Sulfa Antibiotics Itching and Rash     Current Medications: No current facility-administered medications for this encounter.    Current Outpatient Medications:     acetaminophen (TYLENOL) 500 MG tablet, Take 500 mg by mouth every 6 (six) hours as needed.  , Disp: , Rfl:     aspirin EC 81 MG EC tablet, Take 81 mg by mouth nightly, Disp: , Rfl:     Breo Ellipta 100-25 MCG/INH Aerosol Pwdr, Breath Activated, 1 puff daily, Disp: , Rfl:     Calcium Carbonate-Vitamin D (CALTRATE 600+D PO), Take 600 mg by mouth 2 (two) times daily  , Disp: , Rfl:     ciclopirox (PENLAC) 8 % solution, Apply topically nightly Apply over nail and surrounding skin. Apply daily over previous coat. After seven (7) days, may remove with alcohol and continue cycle. To 2 toes on right, Disp: , Rfl:     denosumab (Prolia) 60 MG/ML Solution Prefilled Syringe subcutaneous injection, Inject 60 mg into the skin once Every 6 months, Disp: , Rfl:     escitalopram (LEXAPRO) 5 MG tablet, Take 10 mg by mouth every evening  , Disp: , Rfl:     famotidine (PEPCID) 40 MG tablet, Take 40 mg by mouth daily AC dinner, Disp: , Rfl:     ibuprofen (ADVIL,MOTRIN) 200 MG tablet, Take 200 mg by mouth as needed for Pain, Disp: , Rfl:     Lactobacillus (PROBIOTIC ACIDOPHILUS PO), Take by mouth daily, Disp: , Rfl:      lansoprazole (PREVACID) 30 MG capsule, Take 30 mg by mouth daily, Disp: , Rfl:     loratadine (CLARITIN) 10 MG tablet, Take 10 mg by mouth every evening   , Disp: , Rfl:     losartan (COZAAR) 100 MG tablet, Take 100 mg by mouth daily, Disp: ,  Rfl:     montelukast (SINGULAIR) 10 MG tablet, Take 10 mg by mouth nightly., Disp: , Rfl:     senna-docusate (PERICOLACE) 8.6-50 MG per tablet, Take 2 tablets by mouth 2 (two) times daily as needed for Constipation. (Patient taking differently: Take 2 tablets by mouth daily  ), Disp: 100 tablet, Rfl: 0    ALPRAZolam (XANAX) 1 MG tablet, Take 0.5 mg by mouth nightly as needed  , Disp: , Rfl:     History & Physical     Review of Systems   HENT: Positive for hearing loss (wears b/l hearing aids).    Eyes: Negative.    Respiratory: Positive for shortness of breath.         W/ exertion  Controlled w/ Breo   Cardiovascular: Negative.    Gastrointestinal: Positive for constipation and heartburn.   Genitourinary: Positive for frequency.   Musculoskeletal: Positive for back pain and joint pain.        LBP   Skin: Positive for itching.        Rt groin   Neurological: Negative.    Endo/Heme/Allergies:        Seasonal allergies   Psychiatric/Behavioral: Positive for depression. The patient is nervous/anxious.        Physical Exam  Constitutional:       Appearance: Normal appearance. She is normal weight.   HENT:      Head: Normocephalic and atraumatic.      Right Ear: Ear canal and external ear normal.      Left Ear: Ear canal and external ear normal.      Nose: Nose normal.      Mouth/Throat:      Mouth: Mucous membranes are moist.      Pharynx: Oropharynx is clear.   Eyes:      Extraocular Movements: Extraocular movements intact.      Conjunctiva/sclera: Conjunctivae normal.      Pupils: Pupils are equal, round, and reactive to light.   Cardiovascular:      Rate and Rhythm: Normal rate and regular rhythm.      Pulses: Normal pulses.      Heart sounds: Normal heart sounds.    Pulmonary:      Effort: Pulmonary effort is normal.      Breath sounds: Normal breath sounds.   Abdominal:      General: Bowel sounds are normal.      Palpations: Abdomen is soft.   Genitourinary:     Comments: deferred  Musculoskeletal:      Cervical back: Normal range of motion and neck supple.      Right lower leg: No edema.      Left lower leg: No edema.      Comments: Right knee limited range of motion   Skin:     General: Skin is warm and dry.      Capillary Refill: Capillary refill takes less than 2 seconds.   Neurological:      General: No focal deficit present.      Mental Status: She is alert and oriented to person, place, and time.      Gait: Gait abnormal.   Psychiatric:         Mood and Affect: Mood normal.         Behavior: Behavior normal.         Thought Content: Thought content normal.         Judgment: Judgment normal.     Airway Assessment:  Mallampati score:I  TM distance:>3FB  Neck EXB:MWUX  Mouth opening:full  Dental:implants/crowns, front tooth implant upper    Functional status METS: 5 able to climb stairs w/o chest pain or shortness of breath  STOPBANG:3    Assessment/Plan:     11/4/201-Call rec'd from pt, to inform of home BP's 154/75 rt arm & left arm 186/81 then rechecked 150/72.Also provided xanax dose 0.5, ok'd to take day of surgery. Dietary counseling provided for night before day of surgery.    Preop med order for Ancef not profiled by pharmacy. Ancef test dose ordered as pt is able to tolerate Keflex and rec'd ancef during her 2018 Lt TKA. Surgeon & PA notified via secure chat.    This assessment has been conveyed to anesthesia. Pt was given the opportunity to consult with the anesthesia NP    Patient is medically optimized and is at acceptable risk to proceed with the planned surgery.       The patient will be evaluated the morning of surgery.    Recommend monitoring per hospital OSA protocol perioperatively    Pt will continue to F/U with PCP and or specialists for management  of comorbidities.       Pre op Instructions to Patient  [facility] standard pre operative instructions reviewed by PSS RN and documented accordingly in EPIC.The following reinforced by me:    1.  NPO per hospital policy- Solid food -8 hours prior to scheduled procedure time.  Clear liquids ( water, apple juice, sports drinks such as Gatorade, coffee or tea without milk or cream.  Sugar or sweetener may be added) up until two hours prior to arrival time. Medications taken w/ sip of water in morning day of surgery.  3. NSAIDS/SUPPLEMENTS: None for at least 7-10 days pre op  4. Applicable protocols reviewed (CHG)/ERAS. Written instructions provided  5. COVID-19 screen to be completed 72 hours  prior to DOS. Not required per hospital policy or per surgeon  6.T&S DOS order in epic    Patient voiced understanding all questions and concerns addressed at this time.     Advised pt to call for any changes in health status.    Thank you for allowing me to participate in the care of your patient.    Creig Hines, DNP. AGNP-C  08/18/2020      Creig Hines, DNP, AGNP-C, RN  Nurse Practitioner  PreProcedural Evaluation Clinic [facility])  [facility]  [phone]   (Fax)[phone]  (C) [phone]  Spectralink [phone]  Email: [email or web address]          This communication may contain confidential and/or privileged information. Additionally, this communication may contain protected health information (PHI) that is legally protected from inappropriate disclosure by the Privacy Standards of the [employer] Portability and Accountability Act (HIPAA) and relevant [employer]. If you are not the intended recipient, please note that any dissemination, distribution or copying of this communication is strictly prohibited. If you have received this message in error, you should notify the sender immediately by telephone or by return e-mail and delete this message from your computer. Direct questions to the Electronics engineer at [phone]

## 2020-08-14 LAB — PRESURGICAL SURVEILLANCE, MSSA+MRSA

## 2020-08-16 NOTE — Pre-Procedure Instructions (Signed)
Spoke to Nick-Dr Anise Salvo [phone] re: allergy to pcn and erythromycin and that Ancef is ordered.  States will ask Dr Anise Salvo after he is done with his surgery in [place] and will get back to caller.  [phone] number given to Weston Brass to call back.

## 2020-08-16 NOTE — Pre-Procedure Instructions (Signed)
Pt left VM [phone] asking for a call back to determine how the next day call will come to her this afternoon for confirmation of her surgical time.     Pt called back and discussed the PSS RN will call one number and leave a msg. Pt requests to call her cell number for the Next Day call. Her home # does not always take a msg. Next Day Call PSS RN Genevie Ann, RN) emailed @ [phone] FYI.

## 2020-08-16 NOTE — Pre-Procedure Instructions (Signed)
Day Before Surgery Confirmation Call    Spoke ZO:[id number]    Confirmed surgery date, arrival time, and location.   Arrival: 0800  Surgery:1000    NPO instructions reviewed: Clear liquids up to 2 hours prior to arrival time, then NPO. No solid food 8 hours prior to scheduled procedure time. Examples of clear liquids include water, apple juice, sports drinks such as Gatorade, coffee or tea without milk or cream. Sugar or sweetener may be added    Fasting Requirements per Preoperative Fasting Guidelines for Elective Surgeries and Procedures Requiring Anesthesia Policy Revised 02/2020  Ingested material Fasting requirement   Clear liquids/Ice Chips 2 hours prior to arrival time   Breast milk 4 hours prior to scheduled procedure time   Infant formula 6 hours prior to scheduled procedure time   Non-human milk 8 hours prior to scheduled procedure time   Solid food 8 hours prior to scheduled procedure time       Ambulatory Screening Tool:   Patient denies themselves or anyone in immediate family currently experiencing fever or any symptoms of acute respiratory illness (cough, or shortness of breath).  Patient denies travel outside of the [place] in the last month.   Patient denies being in close contact with a confirmed COVID19 patient.   Patient does not reside in a nursing home or long-term care facility.   Patient denies recent visit to ED, hospitalization or PCP visit for acute illness since PSS RN interview.     Patient instructed that if you or your family does develop new symptoms, to contact surgeons office immediately, prior to arrival at the hospital.     Visitor Restriction Guidelines per [facility] as of 04/29/20:    All visitors must adhere to the following:    Exhibit no COVID-19 symptoms    Age 22+ (internally exceptions can be made by administrative team as needed, e.g., siblings, end of life situations, etc.)    In keeping with the CDCs current guidance, regardless of vaccination status, everyone in  a healthcare facility must wear a mask covering their mouth and nose the entire time they are in the facility. Visitors who fail to wear a mask properly will be asked to leave.    The following face coverings cannot be worn at any [facility] location: gaiter style masks, bandanas or vented masks.    No visitors are allowed for patients with suspected or confirmed COVID-19, except in end-of-life situations.    Hospital Inpatient:   Visitation hours: 9 a.m. - 6:30 p.m. daily    Adult patients may have two visitors, in addition to a [employer] Person (DSP), if applicable    Pediatric patients may have two parents/guardians at bedside 24/7. For pediatric outpatient areas, only parents/guardians may visit  Outpatient/Ambulatory Surgery:    Adult patients may have one visitor, in addition to their Designated Support Person (DSP) on the day of surgery.    No family members will be allowed into Phase 1 recovery areas. Physicians will call contact person to give report of procedure.    Family / visitor will be called by PACU staff  to review discharge instructions via phone and answer any questions.       Advise to call surgeon if need to cancel surgery arises.     Patient verbalized understanding and acceptance of above information.  If a voicemail is left for the patient and any of the COVID19 ambulatory screening questions are positive, patient is asked to call PSS ASAP.

## 2020-08-18 DIAGNOSIS — I10 Essential (primary) hypertension: Secondary | ICD-10-CM | POA: Insufficient documentation

## 2020-08-18 DIAGNOSIS — Z9889 Other specified postprocedural states: Secondary | ICD-10-CM | POA: Insufficient documentation

## 2020-08-18 DIAGNOSIS — K219 Gastro-esophageal reflux disease without esophagitis: Secondary | ICD-10-CM | POA: Insufficient documentation

## 2020-08-18 DIAGNOSIS — J45909 Unspecified asthma, uncomplicated: Secondary | ICD-10-CM | POA: Insufficient documentation

## 2020-08-18 DIAGNOSIS — F419 Anxiety disorder, unspecified: Secondary | ICD-10-CM | POA: Insufficient documentation

## 2020-08-18 DIAGNOSIS — R112 Nausea with vomiting, unspecified: Secondary | ICD-10-CM | POA: Insufficient documentation

## 2020-08-18 NOTE — Anesthesia Preprocedure Evaluation (Signed)
Relevant Problems   ANESTHESIA   (+) Post-operative nausea and vomiting      CARDIO   (+) Hypertension      GI   (+) Gastroesophageal reflux disease       PSS Anesthesia Comments: See detailed PEC NP H&P from 08/13/2020-Mao    Pre-evaluation Note Incomplete - DO NOT USE FOR CLINICAL DECISIONS    Anesthesia Plan                                             Signed by: Dolores Lory, DNP NP 08/18/20 3:50 PM

## 2020-08-19 ENCOUNTER — Encounter: Payer: Self-pay | Admitting: Orthopaedic Surgery

## 2020-08-19 ENCOUNTER — Ambulatory Visit: Payer: Self-pay

## 2020-08-19 ENCOUNTER — Ambulatory Visit: Payer: Medicare Other | Admitting: Acute Care

## 2020-08-19 ENCOUNTER — Inpatient Hospital Stay
Admission: RE | Admit: 2020-08-19 | Discharge: 2020-08-22 | DRG: 470 | Disposition: A | Discharge status: Home / Self Care | Payer: Medicare Other | Source: Ambulatory Visit | Attending: Orthopaedic Surgery | Admitting: Orthopaedic Surgery

## 2020-08-19 ENCOUNTER — Encounter
Admission: RE | Disposition: A | Discharge status: Home / Self Care | Payer: Self-pay | Source: Ambulatory Visit | Attending: Orthopaedic Surgery

## 2020-08-19 ENCOUNTER — Ambulatory Visit: Payer: Medicare Other

## 2020-08-19 DIAGNOSIS — Z7982 Long term (current) use of aspirin: Secondary | ICD-10-CM

## 2020-08-19 DIAGNOSIS — J45909 Unspecified asthma, uncomplicated: Secondary | ICD-10-CM | POA: Diagnosis present

## 2020-08-19 DIAGNOSIS — F419 Anxiety disorder, unspecified: Secondary | ICD-10-CM | POA: Diagnosis present

## 2020-08-19 DIAGNOSIS — R110 Nausea: Secondary | ICD-10-CM | POA: Diagnosis not present

## 2020-08-19 DIAGNOSIS — Z881 Allergy status to other antibiotic agents status: Secondary | ICD-10-CM

## 2020-08-19 DIAGNOSIS — R42 Dizziness and giddiness: Secondary | ICD-10-CM | POA: Diagnosis not present

## 2020-08-19 DIAGNOSIS — Z853 Personal history of malignant neoplasm of breast: Secondary | ICD-10-CM

## 2020-08-19 DIAGNOSIS — I10 Essential (primary) hypertension: Secondary | ICD-10-CM | POA: Diagnosis present

## 2020-08-19 DIAGNOSIS — Z96652 Presence of left artificial knee joint: Secondary | ICD-10-CM | POA: Diagnosis present

## 2020-08-19 DIAGNOSIS — Z885 Allergy status to narcotic agent status: Secondary | ICD-10-CM

## 2020-08-19 DIAGNOSIS — Z880 Allergy status to penicillin: Secondary | ICD-10-CM

## 2020-08-19 DIAGNOSIS — M1711 Unilateral primary osteoarthritis, right knee: Principal | ICD-10-CM | POA: Diagnosis present

## 2020-08-19 DIAGNOSIS — S83279A Complex tear of lateral meniscus, current injury, unspecified knee, initial encounter: Secondary | ICD-10-CM | POA: Diagnosis present

## 2020-08-19 DIAGNOSIS — S83249A Other tear of medial meniscus, current injury, unspecified knee, initial encounter: Secondary | ICD-10-CM | POA: Diagnosis present

## 2020-08-19 DIAGNOSIS — K219 Gastro-esophageal reflux disease without esophagitis: Secondary | ICD-10-CM | POA: Diagnosis present

## 2020-08-19 HISTORY — PX: ARTHROPLASTY, KNEE, TOTAL, COMPUTER NAVIGATION: SHX3136

## 2020-08-19 LAB — TYPE AND SCREEN
AB Screen Gel: NEGATIVE
ABO Rh: O NEG

## 2020-08-19 SURGERY — ARTHROPLASTY, KNEE, TOTAL, COMPUTER NAVIGATION
Anesthesia: Regional | Site: Knee | Laterality: Right | Wound class: Clean

## 2020-08-19 MED ORDER — LACTATED RINGERS IR SOLN
Status: AC | PRN
Start: 2020-08-19 — End: 2020-08-19
  Administered 2020-08-19: 3000 mL

## 2020-08-19 MED ORDER — HYDROXYZINE PAMOATE 25 MG PO CAPS
50.0000 mg | ORAL_CAPSULE | Freq: Two times a day (BID) | ORAL | Status: DC | PRN
Start: 2020-08-19 — End: 2020-08-22

## 2020-08-19 MED ORDER — ALPRAZOLAM 0.25 MG PO TABS
0.5000 mg | ORAL_TABLET | Freq: Every evening | ORAL | Status: DC | PRN
Start: 2020-08-19 — End: 2020-08-22

## 2020-08-19 MED ORDER — FERROUS SULFATE 324 (65 FE) MG PO TBEC
DELAYED_RELEASE_TABLET | Freq: Every day | ORAL | Status: DC
Start: 2020-08-19 — End: 2020-08-22
  Filled 2020-08-19 (×4): qty 1

## 2020-08-19 MED ORDER — FENTANYL CITRATE (PF) 50 MCG/ML IJ SOLN (WRAP)
25.0000 ug | INTRAMUSCULAR | Status: DC | PRN
Start: 2020-08-19 — End: 2020-08-19
  Administered 2020-08-19 (×2): 25 ug via INTRAVENOUS
  Filled 2020-08-19: qty 2

## 2020-08-19 MED ORDER — ACETAMINOPHEN 500 MG PO TABS
ORAL_TABLET | ORAL | Status: AC
Start: 2020-08-19 — End: ?
  Filled 2020-08-19: qty 2

## 2020-08-19 MED ORDER — EPHEDRINE SULFATE 50 MG/ML IJ SOLN
INTRAMUSCULAR | Status: DC | PRN
Start: 2020-08-19 — End: 2020-08-19
  Administered 2020-08-19: 7.5 mg via INTRAVENOUS
  Administered 2020-08-19: 5 mg via INTRAVENOUS

## 2020-08-19 MED ORDER — BISACODYL 10 MG RE SUPP
10.0000 mg | Freq: Every day | RECTAL | Status: DC | PRN
Start: 2020-08-19 — End: 2020-08-22

## 2020-08-19 MED ORDER — CEFAZOLIN SODIUM 1 G IJ SOLR
INTRAMUSCULAR | Status: AC
Start: 2020-08-19 — End: ?
  Filled 2020-08-19: qty 2000

## 2020-08-19 MED ORDER — CALCIUM CITRATE-VITAMIN D 315-250 MG-UNIT PO TABS
1.0000 | ORAL_TABLET | Freq: Two times a day (BID) | ORAL | Status: DC
Start: 2020-08-20 — End: 2020-08-22
  Administered 2020-08-20 – 2020-08-22 (×4): 1 via ORAL
  Filled 2020-08-19 (×5): qty 1

## 2020-08-19 MED ORDER — ASPIRIN 81 MG PO TBEC
81.0000 mg | DELAYED_RELEASE_TABLET | Freq: Two times a day (BID) | ORAL | Status: DC
Start: 2020-08-20 — End: 2020-08-22
  Administered 2020-08-20 – 2020-08-22 (×5): 81 mg via ORAL
  Filled 2020-08-19 (×5): qty 1

## 2020-08-19 MED ORDER — PHENYLEPHRINE HCL 10 MG/ML IV SOLN (WRAP)
Status: DC | PRN
Start: 2020-08-19 — End: 2020-08-19
  Administered 2020-08-19 (×2): 50 ug via INTRAVENOUS

## 2020-08-19 MED ORDER — MEPIVACAINE HCL (PF) 1.5 % IJ SOLN
INTRAMUSCULAR | Status: AC
Start: 2020-08-19 — End: ?
  Filled 2020-08-19: qty 30

## 2020-08-19 MED ORDER — OXYCODONE HCL 5 MG PO TABS
5.0000 mg | ORAL_TABLET | ORAL | Status: DC | PRN
Start: 2020-08-19 — End: 2020-08-22
  Administered 2020-08-19 – 2020-08-21 (×7): 5 mg via ORAL
  Filled 2020-08-19 (×7): qty 1

## 2020-08-19 MED ORDER — LOSARTAN POTASSIUM 100 MG PO TABS
100.0000 mg | ORAL_TABLET | Freq: Every day | ORAL | Status: DC
Start: 2020-08-20 — End: 2020-08-22
  Administered 2020-08-21 – 2020-08-22 (×2): 100 mg via ORAL
  Filled 2020-08-19 (×3): qty 1

## 2020-08-19 MED ORDER — ACETAMINOPHEN 500 MG PO TABS
1000.0000 mg | ORAL_TABLET | ORAL | Status: AC
Start: 2020-08-19 — End: 2020-08-19
  Administered 2020-08-19: 09:00:00 1000 mg via ORAL

## 2020-08-19 MED ORDER — MEPERIDINE HCL 25 MG/ML IJ SOLN
25.0000 mg | Freq: Once | INTRAMUSCULAR | Status: DC | PRN
Start: 2020-08-19 — End: 2020-08-19

## 2020-08-19 MED ORDER — LACTATED RINGERS IV SOLN
INTRAVENOUS | Status: DC
Start: 2020-08-19 — End: 2020-08-19

## 2020-08-19 MED ORDER — FENTANYL CITRATE (PF) 50 MCG/ML IJ SOLN (WRAP)
INTRAMUSCULAR | Status: DC | PRN
Start: 2020-08-19 — End: 2020-08-19
  Administered 2020-08-19 (×3): 25 ug via INTRAVENOUS

## 2020-08-19 MED ORDER — TRANEXAMIC ACID-NACL 1000-0.7 MG/100ML-% IV SOLN
INTRAVENOUS | Status: AC
Start: 2020-08-19 — End: ?
  Filled 2020-08-19: qty 200

## 2020-08-19 MED ORDER — MONTELUKAST SODIUM 10 MG PO TABS
10.0000 mg | ORAL_TABLET | Freq: Every evening | ORAL | Status: DC
Start: 2020-08-19 — End: 2020-08-22
  Administered 2020-08-19 – 2020-08-21 (×3): 10 mg via ORAL
  Filled 2020-08-19 (×3): qty 1

## 2020-08-19 MED ORDER — METOCLOPRAMIDE HCL 5 MG/ML IJ SOLN
5.0000 mg | Freq: Four times a day (QID) | INTRAMUSCULAR | Status: DC | PRN
Start: 2020-08-19 — End: 2020-08-22
  Administered 2020-08-20 – 2020-08-21 (×2): 5 mg via INTRAVENOUS
  Filled 2020-08-19 (×2): qty 2

## 2020-08-19 MED ORDER — PANTOPRAZOLE SODIUM 40 MG PO TBEC
40.0000 mg | DELAYED_RELEASE_TABLET | Freq: Every morning | ORAL | Status: DC
Start: 2020-08-20 — End: 2020-08-22
  Administered 2020-08-20 – 2020-08-22 (×3): 40 mg via ORAL
  Filled 2020-08-19 (×3): qty 1

## 2020-08-19 MED ORDER — BUPIVACAINE HCL (PF) 0.25 % IJ SOLN
INTRAMUSCULAR | Status: DC | PRN
Start: 2020-08-19 — End: 2020-08-19
  Administered 2020-08-19 (×2): 15 mL via EPIDURAL

## 2020-08-19 MED ORDER — MIDAZOLAM HCL 1 MG/ML IJ SOLN (WRAP)
2.0000 mg | Freq: Once | INTRAMUSCULAR | Status: AC
Start: 2020-08-19 — End: 2020-08-19
  Administered 2020-08-19: 10:00:00 2 mg via INTRAVENOUS

## 2020-08-19 MED ORDER — TRANEXAMIC ACID-NACL 1000-0.7 MG/100ML-% IV SOLN
1000.0000 mg | Freq: Once | INTRAVENOUS | Status: DC
Start: 2020-08-19 — End: 2020-08-19

## 2020-08-19 MED ORDER — LIDOCAINE HCL 2 % IJ SOLN
INTRAMUSCULAR | Status: AC
Start: 2020-08-19 — End: ?
  Filled 2020-08-19: qty 20

## 2020-08-19 MED ORDER — FLUTICASONE FUROATE-VILANTEROL 100-25 MCG/INH IN AEPB
1.0000 | INHALATION_SPRAY | Freq: Every morning | RESPIRATORY_TRACT | Status: DC
Start: 2020-08-20 — End: 2020-08-22
  Administered 2020-08-20 – 2020-08-22 (×3): 1 via RESPIRATORY_TRACT
  Filled 2020-08-19: qty 14

## 2020-08-19 MED ORDER — ONDANSETRON HCL 4 MG/2ML IJ SOLN
INTRAMUSCULAR | Status: DC | PRN
Start: 2020-08-19 — End: 2020-08-19
  Administered 2020-08-19: 4 mg via INTRAVENOUS

## 2020-08-19 MED ORDER — POLYMYXIN B SULFATE 500000 UNITS IJ SOLR
INTRAMUSCULAR | Status: DC | PRN
Start: 2020-08-19 — End: 2020-08-19
  Administered 2020-08-19: 500000 [IU]

## 2020-08-19 MED ORDER — PROPOFOL INFUSION 10 MG/ML
INTRAVENOUS | Status: DC | PRN
Start: 2020-08-19 — End: 2020-08-19
  Administered 2020-08-19: 100 ug/kg/min via INTRAVENOUS

## 2020-08-19 MED ORDER — ONDANSETRON 4 MG PO TBDP
4.0000 mg | ORAL_TABLET | Freq: Three times a day (TID) | ORAL | Status: DC | PRN
Start: 2020-08-19 — End: 2020-08-22

## 2020-08-19 MED ORDER — FAMOTIDINE 20 MG PO TABS
20.0000 mg | ORAL_TABLET | Freq: Two times a day (BID) | ORAL | Status: DC
Start: 2020-08-20 — End: 2020-08-22
  Administered 2020-08-20 – 2020-08-22 (×4): 20 mg via ORAL
  Filled 2020-08-19 (×4): qty 1

## 2020-08-19 MED ORDER — PATIENT SUPPLIED NON FORMULARY
1.0000 | Freq: Every evening | Status: DC
Start: 2020-08-19 — End: 2020-08-19

## 2020-08-19 MED ORDER — HYDROMORPHONE HCL 0.5 MG/0.5 ML IJ SOLN
0.5000 mg | INTRAMUSCULAR | Status: DC | PRN
Start: 2020-08-19 — End: 2020-08-19

## 2020-08-19 MED ORDER — DEXTROSE 5 % IV SOLN
2.0000 g | Freq: Once | INTRAVENOUS | Status: AC
Start: 2020-08-19 — End: 2020-08-19
  Administered 2020-08-19: 11:00:00 2 g via INTRAVENOUS

## 2020-08-19 MED ORDER — ESCITALOPRAM OXALATE 10 MG PO TABS
10.0000 mg | ORAL_TABLET | Freq: Every evening | ORAL | Status: DC
Start: 2020-08-19 — End: 2020-08-22
  Administered 2020-08-19 – 2020-08-21 (×3): 10 mg via ORAL
  Filled 2020-08-19 (×4): qty 1

## 2020-08-19 MED ORDER — BUPIVACAINE LIPOSOME 1.3 % IJ SUSP
INTRAMUSCULAR | Status: DC | PRN
Start: 2020-08-19 — End: 2020-08-19
  Administered 2020-08-19 (×2): 10 mL

## 2020-08-19 MED ORDER — STERILE WATER FOR IRRIGATION IR SOLN
Status: DC | PRN
Start: 2020-08-19 — End: 2020-08-19
  Administered 2020-08-19: 1000 mL

## 2020-08-19 MED ORDER — HYDROMORPHONE HCL 2 MG PO TABS
2.0000 mg | ORAL_TABLET | Freq: Once | ORAL | Status: DC | PRN
Start: 2020-08-19 — End: 2020-08-19

## 2020-08-19 MED ORDER — ONDANSETRON HCL 4 MG/2ML IJ SOLN
INTRAMUSCULAR | Status: AC
Start: 2020-08-19 — End: ?
  Filled 2020-08-19: qty 2

## 2020-08-19 MED ORDER — OXYCODONE HCL 10 MG PO TABS
10.0000 mg | ORAL_TABLET | ORAL | Status: DC | PRN
Start: 2020-08-19 — End: 2020-08-22
  Administered 2020-08-19 – 2020-08-20 (×3): 10 mg via ORAL
  Filled 2020-08-19 (×3): qty 1

## 2020-08-19 MED ORDER — MIDAZOLAM HCL 1 MG/ML IJ SOLN (WRAP)
INTRAMUSCULAR | Status: AC
Start: 2020-08-19 — End: ?
  Filled 2020-08-19: qty 2

## 2020-08-19 MED ORDER — OXYCODONE-ACETAMINOPHEN 5-325 MG PO TABS
1.0000 | ORAL_TABLET | Freq: Once | ORAL | Status: DC | PRN
Start: 2020-08-19 — End: 2020-08-19

## 2020-08-19 MED ORDER — TRANEXAMIC ACID-NACL 1000-0.7 MG/100ML-% IV SOLN
1000.0000 mg | INTRAVENOUS | Status: AC
Start: 2020-08-19 — End: 2020-08-19
  Administered 2020-08-19 (×2): 1000 mg via INTRAVENOUS

## 2020-08-19 MED ORDER — PROPOFOL 10 MG/ML IV EMUL (WRAP)
INTRAVENOUS | Status: AC
Start: 2020-08-19 — End: ?
  Filled 2020-08-19: qty 40

## 2020-08-19 MED ORDER — SODIUM CHLORIDE 0.9 % IR SOLN
Status: DC | PRN
Start: 2020-08-19 — End: 2020-08-19
  Administered 2020-08-19: 1000 mL

## 2020-08-19 MED ORDER — DEXTROSE 5 % IV SOLN
2.0000 g | Freq: Three times a day (TID) | INTRAVENOUS | Status: AC
Start: 2020-08-19 — End: 2020-08-20
  Administered 2020-08-19 – 2020-08-20 (×2): 2 g via INTRAVENOUS
  Filled 2020-08-19 (×2): qty 2000

## 2020-08-19 MED ORDER — NALOXONE HCL 0.4 MG/ML IJ SOLN (WRAP)
0.4000 mg | INTRAMUSCULAR | Status: DC | PRN
Start: 2020-08-19 — End: 2020-08-22

## 2020-08-19 MED ORDER — VITAMINS/MINERALS PO TABS
1.0000 | ORAL_TABLET | Freq: Every day | ORAL | Status: DC
Start: 2020-08-19 — End: 2020-08-22
  Administered 2020-08-19 – 2020-08-22 (×3): 1 via ORAL
  Filled 2020-08-19 (×4): qty 1

## 2020-08-19 MED ORDER — MEPIVACAINE HCL (PF) 1.5 % IJ SOLN
INTRAMUSCULAR | Status: DC | PRN
Start: 2020-08-19 — End: 2020-08-19
  Administered 2020-08-19: 3 mL via EPIDURAL

## 2020-08-19 MED ORDER — METOCLOPRAMIDE HCL 5 MG PO TABS
5.0000 mg | ORAL_TABLET | Freq: Four times a day (QID) | ORAL | Status: DC | PRN
Start: 2020-08-19 — End: 2020-08-22

## 2020-08-19 MED ORDER — SODIUM CHLORIDE 0.9 % IV SOLN
INTRAVENOUS | Status: DC
Start: 2020-08-19 — End: 2020-08-22

## 2020-08-19 MED ORDER — LACTATED RINGERS IV SOLN
INTRAVENOUS | Status: DC
Start: 2020-08-19 — End: 2020-08-22

## 2020-08-19 MED ORDER — POLYMYXIN B SULFATE 500000 UNITS IJ SOLR
INTRAMUSCULAR | Status: AC
Start: 2020-08-19 — End: ?
  Filled 2020-08-19: qty 500000

## 2020-08-19 MED ORDER — SODIUM CHLORIDE 0.9% BAG (IRRIGATION USE)
INTRAVENOUS | Status: DC | PRN
Start: 2020-08-19 — End: 2020-08-19
  Administered 2020-08-19: 250 mL

## 2020-08-19 MED ORDER — CETIRIZINE HCL 10 MG PO TABS
10.0000 mg | ORAL_TABLET | Freq: Every day | ORAL | Status: DC
Start: 2020-08-20 — End: 2020-08-22
  Administered 2020-08-20 – 2020-08-22 (×3): 10 mg via ORAL
  Filled 2020-08-19 (×3): qty 1

## 2020-08-19 MED ORDER — FENTANYL CITRATE (PF) 50 MCG/ML IJ SOLN (WRAP)
INTRAMUSCULAR | Status: AC
Start: 2020-08-19 — End: ?
  Filled 2020-08-19: qty 2

## 2020-08-19 MED ORDER — BUPIVACAINE LIPOSOME 1.3 % IJ SUSP
INTRAMUSCULAR | Status: AC
Start: 2020-08-19 — End: ?
  Filled 2020-08-19: qty 20

## 2020-08-19 MED ORDER — DOCUSATE SODIUM 100 MG PO CAPS
100.0000 mg | ORAL_CAPSULE | Freq: Two times a day (BID) | ORAL | Status: DC
Start: 2020-08-19 — End: 2020-08-22
  Administered 2020-08-19 – 2020-08-22 (×6): 100 mg via ORAL
  Filled 2020-08-19 (×6): qty 1

## 2020-08-19 MED ORDER — OXYCODONE HCL ER 10 MG PO T12A
10.0000 mg | EXTENDED_RELEASE_TABLET | ORAL | Status: AC
Start: 2020-08-19 — End: 2020-08-19
  Administered 2020-08-19: 09:00:00 10 mg via ORAL

## 2020-08-19 MED ORDER — ONDANSETRON HCL 4 MG/2ML IJ SOLN
4.0000 mg | Freq: Once | INTRAMUSCULAR | Status: DC | PRN
Start: 2020-08-19 — End: 2020-08-19

## 2020-08-19 MED ORDER — ACETAMINOPHEN 500 MG PO TABS
1000.0000 mg | ORAL_TABLET | Freq: Three times a day (TID) | ORAL | Status: DC
Start: 2020-08-19 — End: 2020-08-22
  Administered 2020-08-19 – 2020-08-22 (×10): 1000 mg via ORAL
  Filled 2020-08-19 (×10): qty 2

## 2020-08-19 MED ORDER — ONDANSETRON HCL 4 MG/2ML IJ SOLN
4.0000 mg | Freq: Three times a day (TID) | INTRAMUSCULAR | Status: DC | PRN
Start: 2020-08-19 — End: 2020-08-22
  Administered 2020-08-20 (×2): 4 mg via INTRAVENOUS
  Filled 2020-08-19 (×2): qty 2

## 2020-08-19 MED ORDER — PROMETHAZINE HCL 25 MG/ML IJ SOLN
6.2500 mg | Freq: Once | INTRAMUSCULAR | Status: DC | PRN
Start: 2020-08-19 — End: 2020-08-19

## 2020-08-19 MED ORDER — OXYCODONE HCL ER 10 MG PO T12A
EXTENDED_RELEASE_TABLET | ORAL | Status: AC
Start: 2020-08-19 — End: ?
  Filled 2020-08-19: qty 1

## 2020-08-19 MED ORDER — RISAQUAD PO CAPS
1.0000 | ORAL_CAPSULE | Freq: Every day | ORAL | Status: DC
Start: 2020-08-20 — End: 2020-08-22
  Administered 2020-08-20 – 2020-08-22 (×3): 1 via ORAL
  Filled 2020-08-19 (×3): qty 1

## 2020-08-19 SURGICAL SUPPLY — 159 items
ADHESIVE DERMABOND PRINEO 22CM (Skin Closure) ×1
ADHESIVE SKIN CLOSURE DERMABOND PRINEO (Skin Closure) ×1 IMPLANT
ADHESIVE SKIN CLOSURE DERMABOND PRINEO LIQUID 2-OCTYL CYANOACRYLATE (Skin Closure) ×1 IMPLANT
ADHESIVE SKNCLS 2-OCTYL CYNCRLT DRMBND (Skin Closure) ×1
APPLICATOR CHLORAPREP 26 ML 70% ISOPROPYL ALCOHOL 2% CHLORHEXIDINE (Applicator) ×1 IMPLANT
APPLICATOR PRP 70% ISPRP 2% CHG 26ML (Applicator) ×1
BANDAGE CMPR PLSTR CTTN PRCR 5YDX6IN LF (Procedure Accessories) ×1
BANDAGE ELASTIC SN 6IN (Procedure Accessories) ×1
BANDAGE PROCARE COMPRESSION L5 YD X W6 (Procedure Accessories) ×1 IMPLANT
BANDAGE PROCARE COMPRESSION L5 YD X W6 IN 2 CLIP FASTENER BREATHABLE (Procedure Accessories) ×1 IMPLANT
BASEPLATE TIB 3 TRTHLN STRL CMNT PRM KN (Base) ×1 IMPLANT
BASEPLATE TIBIAL 3 KNEE CEMENT PRIMARY (Base) ×1 IMPLANT
BASEPLATE TIBIAL 3 KNEE CEMENT PRIMARY TRIATHLON (Base) ×1 IMPLANT
BASEPLT TRIATH TIB SZ3 (Base) ×1 IMPLANT
BATTERY SRG DRVR LF (Other) ×3 IMPLANT
BATTERY SURGICAL DRIVER INSTRUMENT NA (Other) ×3 IMPLANT
BIPOLAR SEALER MALLEABLE (Cautery) ×1
BLADE 9X25.4MM (Blade) ×1
BLADE SAW SAGITAL 90X19X1.27MM (Blade) ×1
BLADE SAW STRYKER 7/6/5/4/2000 EHD THK1.27 MM SAGITTAL AGGRESSIVE CUT (Blade) ×1 IMPLANT
BLADE SAW THK1.27 MM OSCILLATE SAGITTAL (Blade) ×2 IMPLANT
BLADE SAW THK1.27 MM OSCILLATE SAGITTAL BR2000 L90 MM X W25 MM LARGE (Blade) ×1 IMPLANT
BLADE SW THK1.27MM BR2000 90X19MM (Blade) ×1
BLADE SW THK1.27MM BR2000 90X25MM (Blade) ×1
BRACE KNEE TRI-PANEL 19IN (Immobilizer) ×1
CEMENT BN CBLT LF STRL HVISC 40GM (Cement) ×2 IMPLANT
CEMENT BONE HIGH VISCOSITY COBALT 40 GM (Cement) ×2 IMPLANT
CEMENT COBALT HV HI CNTRST 40G (Cement) ×2 IMPLANT
CHLORAPREP APLCTR ORANGE 26ML (Applicator) ×1
COMP TRIATH CR FMRL CMT SZ3 RT (Femoral) ×1 IMPLANT
COMPONENT FEM 3 TRTHLN LF STRL CRCTE RTN (Femoral) ×1 IMPLANT
COMPONENT FEMORAL 3 KNEE RIGHT CRUCIATE RETAIN CEMENT TRIATHLON (Femoral) ×1 IMPLANT
COMPONENT FEMORAL 3 KNEE RT CRUCIATE (Femoral) ×1 IMPLANT
COMPONENT PATELLAR H10 MM OD32 MM (Joint) ×1 IMPLANT
COMPONENT PATELLAR H10 MM OD32 MM ASYMMETRIC TRIATHLON X3 KNEE (Joint) ×1 IMPLANT
COMPONENT PTLR X3 TRTHLN 32MM 10MM STRL (Joint) ×1 IMPLANT
COVER CAM LF STRL CLR LEN (Procedure Accessories) ×1 IMPLANT
COVER CAMERA CLEAR LENS STERILE LATEX FREE (Procedure Accessories) ×1 IMPLANT
COVER CAMERA OR STERILE (Procedure Accessories) ×1
CUFF TOURNIQUET CYLINDRICAL L30 IN X W4 IN 2 PORT 1 BLADDER QUICK (Procedure Accessories) ×1 IMPLANT
CUFF TRNQT CYL CLR CUF 30X4IN LF STRL 2 (Procedure Accessories) ×1
DEVICE SUT TRCLSN PDS 1 CT1 STRATAFIX (Suture) ×1
DRAPE LRG FILM W/ U SPLIT (Drape) ×1
DRAPE SPAC STATN SURG 30X72IN (Drape) ×1
DRAPE SRG SMS .75 PRXM 77X53IN LF STRL (Drape) ×3
DRAPE SRG SMS U PRXM 84X60IN LF STRL (Drape) ×1
DRAPE SURG 3/4 53X77IN (Drape) ×3
DRAPE SURGICAL IMPERVIOUS LARGE FILM L84 (Drape) ×1 IMPLANT
DRAPE SURGICAL IMPERVIOUS LARGE FILM L84 IN X W60 IN SMS U (Drape) ×1 IMPLANT
DRAPE SURGICAL SHEET L77 IN X W53 IN (Drape) ×3 IMPLANT
DRAPE SURGICAL SHEET L77 IN X W53 IN MEDLINE SMS 3/4 BLUE (Drape) ×3 IMPLANT
DRAPE TABLE 1 PIECE COVER BARRIER (Drape) ×1 IMPLANT
DRAPE TABLE 1 PIECE COVER BARRIER PLASTIC PEDIGO PRODUCTS, INC. CLEAR (Drape) ×1 IMPLANT
DRAPE TBL PLS STRL 1 PC CVR BRR DISP CLR (Drape) ×1
DRESSING FM SLVR SLF MPLX BR 10X4IN PS (Dressing) ×1
DRESSING L10 IN X W4 IN MEPILEX BORDER (Dressing) ×1 IMPLANT
DRESSING L10 IN X W4 IN MEPILEX BORDER FOAM SILVER SULPHATE (Dressing) ×1 IMPLANT
DRESSING MEPILEX BDR AG 4X10IN (Dressing) ×1
GLOVE BIOGEL SURG PF SZ 8.5 (Glove) ×3
GLOVE SRG 7.5 BGL SRG LTX STRL PF BEAD (Glove) ×3
GLOVE SRG NTR RBR 8.5 BGL SRG LTX STRL (Glove) ×3
GLOVE SRG PLISPRN 8 BGL PI INDCTR (Glove) ×1
GLOVE SRG PLISPRN 8.5 BGL PI INDCTR (Glove) ×1
GLOVE SURG BIOGEL LTX PF SZ7.5 (Glove) ×3
GLOVE SURG INDCTR STRL SZ 8 RT (Glove) ×1
GLOVE SURGICAL 7 1/2 BIOGEL SURGEONS (Glove) ×3 IMPLANT
GLOVE SURGICAL 7 1/2 BIOGEL SURGEONS POWDER FREE BEAD CUFF TEXTURE (Glove) ×3 IMPLANT
GLOVE SURGICAL 8 1/2 BIOGEL PI INDICATOR (Glove) ×1 IMPLANT
GLOVE SURGICAL 8 1/2 BIOGEL PI INDICATOR UNDERGLOVE POWDER FREE SMOOTH (Glove) ×1 IMPLANT
GLOVE SURGICAL 8 1/2 BIOGEL SURGEONS (Glove) ×3 IMPLANT
GLOVE SURGICAL 8 1/2 BIOGEL SURGEONS POWDER FREE BEAD CUFF TEXTURE (Glove) ×3 IMPLANT
GLOVE SURGICAL 8 BIOGEL PI INDICATOR (Glove) ×1 IMPLANT
GLOVE SURGICAL 8 BIOGEL PI INDICATOR UNDERGLOVE POWDER FREE SMOOTH (Glove) ×1 IMPLANT
HANDLE LGHT ASPN LF STRL SNPON (Procedure Accessories) ×1
HANDLE LIGHT ALC PLUS DISPOSABLE STERILE (Procedure Accessories) ×1
HANDLE LIGHT SNAP ON ASPEN (Procedure Accessories) ×1 IMPLANT
HANDPIECE INTERPLUSE HIGH FLOW (Cautery) ×2
HOOD FLYTE PEELAWAY (Procedure Accessories) ×3
HOOD SRG FLYTE STRL PLWY LEN LVL 1 DISP (Procedure Accessories) ×3
HOOD SURGEON ZIPPER PEEL AWAY LENS FLYTE (Procedure Accessories) ×3 IMPLANT
IMMOBILIZER KNEE UNIVERSAL L19 IN CANVAS (Immobilizer) ×1 IMPLANT
IMMOBILIZER KNEE UNIVERSAL L19 IN CANVAS ORTHOPEDIC DEROYAL OD12-24 IN (Immobilizer) ×1 IMPLANT
IMMOBILIZER ORTH CNVS UNV 12-24IN 19IN (Immobilizer) ×1
INSERT TIB X3 3 TRTHLN 11MM LF STRL BRNG (Insert) ×1 IMPLANT
INSERT TIBIAL 3 TRIATHLON H11 MM KNEE (Insert) ×1 IMPLANT
INSERT TIBIAL 3 TRIATHLON H11 MM KNEE BEARING CRUCIATE RETAIN X3 (Insert) ×1 IMPLANT
INSERT TRIATH CR SZ3 11MM (Insert) ×1 IMPLANT
INSTRUMENT BATTERY EA=1 (Other) ×3
KIT INFECTION CONTROL CUSTOM (Kits) ×2 IMPLANT
KIT INFECTION CONTROL CUSTOM IFOH03 (Kits) ×1 IMPLANT
MRKR SKN W RULER AND LABELS (Positioning Supplies) ×1
NEEDLE SPINAL BD OD18 GA L3 1/2 IN (Needles) ×1 IMPLANT
NEEDLE SPINAL DISP 18GX3.5IN (Needles) ×1
NEEDLE SPINAL L3 1/2 IN REGULAR WALL QUINCKE TIP OD18 GA BD (Needles) ×1 IMPLANT
NEEDLE SPNL PP RW BD QNCK 18GA 3.5IN LF (Needles) ×1
PACK SURGICAL BASIN SET FOAK (Other) ×1 IMPLANT
PACK SURGICAL BASIN SET FOAK MEDLINE INDUSTRIES, INC. (Other) ×1 IMPLANT
PATELLA ASYMMETRIC A3E (Joint) ×1 IMPLANT
PEN SRGMRK 6IN LF STRL RLR LG RSRV REG (Positioning Supplies) ×1
PEN SURGICAL MARKING MEDLINE SKIN RULER (Positioning Supplies) ×1 IMPLANT
PEN SURGICAL MARKING SKIN RULER LARGE RESERVOIR REGULAR TIP LABEL (Positioning Supplies) ×1 IMPLANT
PIN FIXATION OD3.18 MM PRELOAD L90 MM (Procedure Accessories) ×2 IMPLANT
PIN FIXATION OD3.18 MM PRELOAD L90 MM PINABALL (Procedure Accessories) ×2 IMPLANT
PIN FX PINABALL 3.18MM 90MM STRL PRELD (Procedure Accessories) ×2
PIN PRLDD 3.2X90MM S EA=1 (Procedure Accessories) ×2
REVOLUTION CMS W/BREAKAWAY (Other) ×1
RINGER LAC IRRIG ARHTRO (Irrigation Solutions) ×1
SET BASIN SINGLE DISP (Other) ×2
SET HANDPIECE SUCTION TUBE HIGH FLOW TIP (Cautery) ×1 IMPLANT
SET HANDPIECE SUCTION TUBE HIGH FLOW TIP INTERPULSE (Cautery) ×1 IMPLANT
SLN ALC ISOPROPYL 70 4OZ (Scrub Supplies) ×1
SOLN IRRISEPT WOUND DEBRIDEMNT (Solution) ×1
SOLUTION ANSEP 70% ISPRP 4OZ LF RUB BTL (Scrub Supplies) ×1
SOLUTION ANTISEPTIC RUB BOTTLE MEDLINE (Scrub Supplies) ×1 IMPLANT
SOLUTION IRR LR 3L ARTHMTC LF PLS CNTNR (Irrigation Solutions) ×1
SOLUTION IRRIGATION LACTATED RINGERS (Irrigation Solutions) ×1
SOLUTION IRRIGATION LACTATED RINGERS 3000 ML PLASTIC CONTAINER (Irrigation Solutions) ×1 IMPLANT
SPONGE GAUZE L4 IN X W4 IN 12 PLY (Sponge) ×1 IMPLANT
SPONGE GAUZE STR 10'S 12PLY4X4 (Sponge) ×1
SPONGE GZE PLS CTTN CRTY 4X4IN LF STRL (Sponge) ×1
SUTURE ABS 1 CT1 VCL 27IN BRD COAT UD (Suture) ×3
SUTURE ABS 2-0 CT1 VCL 27IN BRD COAT UD (Suture) ×2
SUTURE ABS 4-0 PS2 MNCRL MTPS 18IN MFL (Suture) ×1
SUTURE COATED VICRYL 1 CT-1 L27 IN BRAID (Suture) ×3 IMPLANT
SUTURE COATED VICRYL 1 CT-1 L27 IN BRAID COATED UNDYED ABSORBABLE (Suture) ×3 IMPLANT
SUTURE COATED VICRYL 2-0 CT-1 L27 IN (Suture) ×2 IMPLANT
SUTURE COATED VICRYL 2-0 CT-1 L27 IN BRAID COATED UNDYED ABSORBABLE (Suture) ×2 IMPLANT
SUTURE MONOCRYL 4-0 PS-2 L18 IN (Suture) ×1 IMPLANT
SUTURE MONOCRYL 4-0 PS-2 L18 IN MONOFILAMENT UNDYED ABSORBABLE (Suture) ×1 IMPLANT
SUTURE MONOCRYL 4-0 PS2 18 IN (Suture) ×1
SUTURE STRTAFX PDS+ 1CT 45CM (Suture) ×1
SUTURE TRICLOSAN POLYDIOXANONE STRATAFIX (Suture) ×1 IMPLANT
SUTURE TRICLOSAN POLYDIOXANONE STRATAFIX SYMMETRIC PDS PLUS 1 CT-1 L45 (Suture) ×1 IMPLANT
SUTURE VICRYL 1 CT1 27IN (Suture) ×3
SUTURE VICRYL 2.0 CT1 27IN (Suture) ×2
SYRINGE 50 ML GRADUATE NONPYROGENIC DEHP (Syringes, Needles) ×1 IMPLANT
SYRINGE 50 ML GRADUATE NONPYROGENIC DEHP FREE PVC FREE LOK MEDICAL (Syringes, Needles) ×1 IMPLANT
SYRINGE LUER LOK 50ML (Syringes, Needles) ×1
SYRINGE MED 50ML LL LF STRL GRAD N-PYRG (Syringes, Needles) ×1
SYSTEM BN CMNT REV LF MX BRKWY NOZ (Other) ×1
SYSTEM BONE CEMENT MIX BREAKAWAY NOZZLE (Other) ×1 IMPLANT
SYSTEM BONE CEMENT MIX BREAKAWAY NOZZLE REVOLUTION (Other) ×1 IMPLANT
SYSTEM WND IRR 0.05% CHG IRRISEPT LF (Solution) ×1
SYSTEM WOUND IRRIGATION DEBRIDEMENT (Solution) ×1
SYSTEM WOUND IRRIGATION DEBRIDEMENT CLEANSING IRRISEPT 0.05% (Solution) ×1 IMPLANT
TIP SCT VNYL LG ARG YNKR SLC-TRL 24FR LF (Suction) ×1
TIP SUCTION SELEC-TROL LARGE OD24 FR (Suction) ×1 IMPLANT
TIP SUCTION SELEC-TROL LARGE OD24 FR VINYL (Suction) ×1 IMPLANT
TOURNIQUET 30X4 BLUE (Procedure Accessories) ×1
TRAY CATH RBR BARD 15FR LTX SPEC CNTNR (Catheter Miscellaneous) IMPLANT
TRAY SRG TOTAL KNEE ~~LOC~~ (Pack) ×1 IMPLANT
TRAY SURGICAL TOTAL KNEE ~~LOC~~ (Pack) ×1 IMPLANT
TRAY TOTAL KNEE ~~LOC~~ (Pack) ×1
TRAY URETH CATHERIZA (Catheter Micellaneous)
UNDRGLOV SZ8.5 PI INDICATR BLU (Glove) ×1
UNIT ELECTROSURGICAL L5.74 MM 30 D (Cautery) ×1 IMPLANT
UNIT ELECTROSURGICAL L5.74 MM 30 D BIPOLAR SEALER LIGHT HEMOSTATIC (Cautery) ×1 IMPLANT
UNIT ESURG 30D AQUAMANTYS 3.48MM 5.74MM (Cautery) ×1
YANKAUER ARGYLE SUCT OPEN TIP (Suction) ×1

## 2020-08-19 NOTE — Progress Notes (Signed)
Time out performed with anesthesiologist. Patient tolerated the procedure with no adverse side effects. Procedure discussed prior to start. Peripheral nerve block education initiated, including fall precautions/sensory deficits and pain management.  Patient and responsible adult verbalized understanding.

## 2020-08-19 NOTE — Anesthesia Procedure Notes (Signed)
Spinal      Patient location during procedure: OR  Reason for block: Primary Anesthesia In the OR    Block at Surgeon's request: Yes          Staffing  Performed: anesthesiologist   Anesthesiologist: Kathleen Argue, MD      Pre-procedure Checklist   Completed: patient identified, surgical consent, pre-op evaluation, timeout performed, risks and benefits discussed, monitors and equipment checked, anesthesia consent given and correct site      Spinal  Patient monitoring: pulse oximetry, NIBP, face mask O2 and EKG      Patient position: sitting    Sterile Technique: chlorhexidine gluconate and isopropyl alcohol  Skin Local: lidocaine 1%        Approach: midline  Number of attempts: 3      Needle Placement    Needle gauge: 25      Intrathecal space entered at L2-3        Paresthesia Pain: no    Catheter Placement   CSF Return: Yes  Blood Return: No              Assessment   Sensory level: T10  Block Outcome: patient tolerated procedure well, no complications and successful block  Additional Notes  Patient has scoliosis

## 2020-08-19 NOTE — Op Note (Signed)
FULL OPERATIVE NOTE    Date Time: 08/19/20 4:43 PM  Patient Name: [name]  Attending Physician: Cathey Endow, MD      Date of Operation:   08/19/2020    Providers Performing:   Surgeon(s):  Cathey Endow, MD  [place], [place], [place]    Circulator: Crista Elliot, RN  Relief Scrub: Victoriano Lain, RN  Scrub Person: Dorita Sciara  Preceptor: Karyl Kinnier, RN    Operative Procedure:   Right total knee arthroplasty    Preoperative Diagnosis:   Right knee osteoarthritis    Postoperative Diagnosis:   Right knee osteoarthritis    Anesthesia   Spinal  Adductor canal block  iPACK block    Estimated Blood Loss:   30 mL    Implants:     Implant Name Type Inv. Item Serial No. Manufacturer Lot No. LRB No. Used Action   CEMENT COBALT HV HI CNTRST 40G - [id number] Cement CEMENT COBALT HV HI CNTRST 40G  [employer] [id number] Right 1 Implanted   CEMENT COBALT HV HI CNTRST 40G - [id number] Cement CEMENT COBALT HV HI CNTRST 40G  DJO SURGICAL [id number] Right 1 Implanted   BASEPLT TRIATH TIB SZ3 - [id number] Base BASEPLT TRIATH TIB SZ3  [employer] [id number] Right 1 Implanted   COMP TRIATH CR FMRL CMT SZ3 RT - [id number] Femoral COMP TRIATH CR FMRL CMT SZ3 RT  STRYK[employer] [id number] Right 1 Implanted   PATELLA ASYMMETRIC A3E - [id number] Joint PATELLA ASYMMETRIC A3E  STRYK[employer] [id number] Right 1 Implanted   INSERT TRIATH CR SZ3 - [id number] Insert INSERT TRIATH CR SZ3  [employer] [id number] Right 1 Implanted       Drains:   None    Specimens:   None    Complications:   None    Indications:   The patient is a 79 year old female with longstanding history of pain in the right knee secondary to osteoarthritis.  After prolonged conservative management, the patient was offered a total knee arthroplasty.  The risks and benefits of the procedure were described.  Risks include but are not  limited to, bleeding, infection, neurovascular injury, persistent pain,  stiffness, loosening, wear, fracture,  instability, DVT, pulmonary embolism, loss of limb, stroke, heart attack, death, and the risks of anesthesia.  The patient indicated their  understanding and agreed to proceed.    Operative Notes:   The patient was brought to the preoperative holding area and identified and  identified as Kelsey S.  The right knee was marked as the appropriate operative site.  Preoperative antibiotics were administered prior to  surgical incision.  An adductor canal block and iPack block  was placed by anesthesia with no complications. The patient was then brought to the operating room and placed on the operating room table.  Spinal anesthesia was administered by the anesthesiologist with no complications. A tourniquet was applied to the thigh.  The right lower extremity was prepped and draped in the usual standard fashion.  An operative timeout was performed and the operative team unanimously confirmed the patient, procedure and operative site.  A midline incision and medial  parapatellar arthrotomy was performed.  The medial capsular sleeve was elevated  to the mid coronal plane.  The patella was subluxed laterally.  The ACL was intact. The PCL was intact.  Inspection of the knee demonstrated advanced osteoarthritis.  At this time decision was made to use a tourniquet.  At  this time, an Esmarch was used  to exsanguinate the extremity and the  tourniquet was inflated to 250 mm Hg.   The ACL was excised. At this time, the Stryker navigation array was pinned to the distal femur. We then registerred the hip center and distal femur. The distal femoral cutting block was then attached and adjusted to take a 9 mm resection from the high side in 3 degrees of flexion and neutral mechanical axis. The array was then removed. The distal femoral resection was than made and the cutting block was removed. With the knee flexed,  the femoral sizing guide was applied to the femur and the femur sized to a size 3.  The size 3, 4-in-1 femoral  cutting block was pinned parallel to the transepicondylar axis. The finishing cuts were then made.  The Stryker navigation array was then  pinned to the tibial articular surface and the proximal tibia and medial and lateral malleolus were registered. The tibial resection block was placed and adjusted to make a resection perpendicular to the long axis of the tibia in the frontal plane with 3 degrees of posterior slope, taking 9 mm off the high side. The array was then removed and the resection was made. The cutting block was then removed.  With the knee in extension, the extension gap was checked with a spacer block and overall alignment was checked with rods.  The medial and lateral meniscus were excised and osteophytes were removed from the posterior aspect of the femoral condyles.  Flexion and extension gaps were checked to ensure they were well balanced.  The tibia was sized to a size 3. A size 3 tibial base plate was pinned and aligned to the tibial tubercle.  The trial size 3 cruciate retaining femoral component was then placed.  Trial inserts were then placed.  With a 11 mm thick trial insert, there was excellent varus and valgus  stability.  The knee was taken through a range of motion.  There was excellent patellar tracking.  Attention was turned to the patella.   Osteophytes were removed from the patella.  The patella was cut with an  oscillating saw and sized to size 32-mm asymmetric component.  A 32-mm template was placed. Three peg holes were drilled.  A trial 32-mm asymmetric component was then placed.  The knee was reduced and taken through a range of motion.  There  was excellent patellar tracking.  At this time, the trial patellar component, femoral component and insert were removed.  Tibial rotation was set with a keel punch.  Trial tibial component was removed.  The knee was copiously irrigated with pulse lavage irrigation.  Bone surfaces were dried.   The final components were cemented into place  using bonr cement (Stryker Triathalon total knee arthroplasty size 3 CR right femoral component, size 3 tibial component, and a size right mm asymmetric patellar component.  Excess cement was removed from the periphery of the components.  A trial insert was placed while the cement cured. After the cement had cured, I again took the knee through a range of motion with a 32-mm thick insert, and there was excellent varus-valgus stability,  range of motion and patellar tracking.  At this time, the trial insert was  removed.  The tourniquet was deflated and hemostasis was obtained. A solution of irrisept dilute sterile chlorhexidine was poured in the wound, allowed to sit for approximately a few minutes, and then irrigated and suctioned out.   Copious irrigation was used and the knee was irrigated  and suctioned. The final 11-mm thick insert was then placed.  The knee was reduced.  The arthrotomy was closed with #1 Vicryl.  Subcutaneous tissue was closed with 2-0 Vicryl and the skin was closed with 4-0 monocryl subcuticular suture followed by Dermabond.  A waterproof dressing was then applied. The patient was awoken and transported to the recovery room in stable condition.      POSTOPERATIVE PLAN:  The patient will be admitted to the orthopedic floor and receive 24 hours  of prophylactic intravenous antibiotics.  DVT prophylaxis will include early mobilization and SCDs, and aspirin will be started on postoperative day #1.              Signed by: Cathey Endow, MD, MD

## 2020-08-19 NOTE — Transfer of Care (Signed)
Anesthesia Transfer of Care Note    Patient: [name]    Procedures performed: Procedure(s):  ARTHROPLASTY, KNEE, TOTAL, COMPUTER NAVIGATION    Anesthesia type: Spinal    Patient location:Phase I PACU    Last vitals:   sats = 97% with NC, 153/73, HR = 62    Post pain: Patient not complaining of pain, continue current therapy      Mental Status:awake    Respiratory Function: tolerating nasal cannula    Cardiovascular: stable    Nausea/Vomiting: patient not complaining of nausea or vomiting    Hydration Status: adequate    Post assessment: no apparent anesthetic complications, no reportable events and no evidence of recall    Signed by: Betsy Pries, CRNA  08/19/20 12:31 PM

## 2020-08-19 NOTE — Anesthesia Postprocedure Evaluation (Signed)
Anesthesia Post Evaluation    Patient: [name]    Procedure(s):  ARTHROPLASTY, KNEE, TOTAL, COMPUTER NAVIGATION    Anesthesia type: spinal    Last Vitals:   Vitals Value Taken Time   BP 163/71 08/19/20 1320   Temp 36.3 C (97.4 F) 08/19/20 1230   Pulse 60 08/19/20 1320   Resp 18 08/19/20 1320   SpO2 98 % 08/19/20 1320                 Anesthesia Post Evaluation:     Patient Evaluated: PACU  Patient Participation: complete - patient participated  Level of Consciousness: awake and alert  Pain Score: 0  Pain Management: adequate    Airway Patency: patent    Anesthetic complications: No      PONV Status: none    Cardiovascular status: acceptable  Respiratory status: acceptable  Hydration status: acceptable        Signed by: Kathleen Argue, MD, 08/19/2020 1:25 PM

## 2020-08-19 NOTE — Progress Notes (Signed)
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  Pain controlled.  No complaints.    Physical Exam:     Vitals:    08/19/20 1500   BP: 176/67   Pulse:    Resp:    Temp:    SpO2:        Intake and Output Summary (Last 24 hours) at Date Time    Intake/Output Summary (Last 24 hours) at 08/19/2020 1637  Last data filed at 08/19/2020 1223  Gross per 24 hour   Intake 1200 ml   Output 390 ml   Net 810 ml       Right knee  - Dressing  in place.  No erythema or drainage.  - Calf soft and non-tender.  - Palpable dorsalis pedis and posterior tibial pulse  - Sensation to light touch intact throughout the left foot  - Motor: Intact toe flexion/extension and ankle dorsiflexion/plantarflexion      Labs:     Results     Procedure Component Value Units Date/Time    Type and Screen [[id number]] Collected: 08/19/20 0901    Specimen: Blood Updated: 08/19/20 0949     ABO Rh O NEG     AB Screen Gel NEG              Signed by: Cathey Endow, MD

## 2020-08-19 NOTE — Progress Notes (Signed)
Admission Note-   Patient admitted to Surgical unit at 1432. Hand off report received from PACU nurse.     Patient oriented to room, bed in lowest position, bed alarm activated, call bell within reach.    Fall prevention contract completed. Yes   Patient informed of visitor policy.   Belongings checked and placed at closet.     Brief Clinical Picture:  A&O x4. Pt is on room air. Upon arrival, Inetta Fermo Banker) noticed that pt's peripheral IV was out. Inserted a new IV and pt tolerated well. Vital signs were checked. BP was elevated. Re checked her BP (176/67). Left knee surgical site is C/D/I. Ice pack applied. Applied foot pump on her bilateral feet. Pt reported moderate pain and mild numbness in her surgical leg. Pt was able to walk to the bathroom with walker and voided. Pt tolerates her diet well and denies any nausea. Bed is in low position with call light within reach. Will continue to monitor.     Skin Assessment  Two nurse skin assessment performed with Misty Stanley, RN. Blanchable redness on her left heel .  Redness on her second toe on her left foot noted due to fugal infection per pt. No other redness or skin breakdown noted.

## 2020-08-19 NOTE — Anesthesia Procedure Notes (Signed)
Peripheral    Patient location during procedure: Pre-Op  Reason for block: Post-op pain management      Injection technique: Single Shot  Block Region: Adductor Canal/Mid-Thigh Femoral  Laterality: Right      Block at surgeon's request Yes    Start time: 08/19/2020 9:37 AM  End time: 08/19/2020 9:44 AM      Staffing  Anesthesiologist: Kathleen Argue, MD  Performed: Anesthesiologist       Pre-procedure Checklist   Completed: patient identified, surgical consent, pre-op evaluation, timeout performed, risks and benefits discussed, anesthesia consent given and correct site  Timeout Completed:  08/19/2020 9:37 AM      Peripheral Block  Patient monitoring: Pulse oximetry, EKG, NIBP and Nasal cannula O2  Patient position: Supine  Premedication: Meaningful contact maintained and Yes  Local infiltration: Lidocaine 1%      Needle  Needle type: Stim needle   Needle gauge: 21 G  Needle length: 4 in      Guidance: ultrasound guided  Ultrasound Guided: LA spread visualized, Needle visualized, Relevant anatomy identified (nerve, vessels, muscle) and Image stored or printed          Assessment   Incremental injection: yes  Injection made incrementally with aspirations every 3 mL.  Injection Resistance: no  Paresthesia Pain: No    Blood Aspirated: No  no suspected intravascular injection  Patient tolerated procedure well: Yes  Block Outcome: No complications and Successful block

## 2020-08-19 NOTE — Anesthesia Procedure Notes (Signed)
Peripheral    Patient location during procedure: Pre-Op  Reason for block: Primary anesthetic in the OR      Injection technique: Single Shot  Block Region: IPACK  Laterality: Right      Block at surgeon's request Yes    Start time: 08/19/2020 9:37 AM  End time: 08/19/2020 9:44 AM      Staffing  Anesthesiologist: Kathleen Argue, MD  Performed: Anesthesiologist       Pre-procedure Checklist   Completed: patient identified, surgical consent, pre-op evaluation, timeout performed, risks and benefits discussed, anesthesia consent given and correct site  Timeout Completed:  08/19/2020 9:37 AM      Peripheral Block  Patient monitoring: Pulse oximetry, EKG, Nasal cannula O2 and NIBP  Patient position: Supine  Premedication: Yes  Local infiltration: Lidocaine 1%      Needle  Needle type: Stim needle   Needle gauge: 21 G  Needle length: 10 cm      Guidance: ultrasound guided  Ultrasound Guided: LA spread visualized, Needle visualized, Relevant anatomy identified (nerve, vessels, muscle) and Image stored or printed          Assessment   Incremental injection: yes  Injection made incrementally with aspirations every 3 mL.  Injection Resistance: no  Paresthesia Pain: No    Blood Aspirated: No  no suspected intravascular injection  Patient tolerated procedure well: Yes  Block Outcome: No complications and Successful block

## 2020-08-19 NOTE — Interval H&P Note (Signed)
Seen and examined. H and P reviewed. No interval changes.    The patient is a 79 year old female with a history of right knee osteoarthritis.  Pain has gotten progressively worse despite conservative measures. Pain is rated 7-9/10. Functional limitations include difficulty with ambulation, ascending and descending stairs, and increased activity.  Safety concerns include difficulty with ADLs.    The patient has taken NSAIDs and Tylenol for pain for years.  They have tried exercises in the form of home exercises and PT for many months. The patient has failed use of assistive devices including a cane. The patient has also tried injections with no significant long term improvement.    PE:    Right knee    - Alignment neutral  - Trace effusion  - Skin intact  - ROM 0-120  - Tenderness on palpation of the medial joint line.  - Crepitation with ROM  - NVI distally    - Antalgic gait    X-rays right knee: Joint space narrowing, subchondral sclerosis, osteophyte formation.    Impression: Right knee osteoarthritis    Plan:  The patient has tried extensive conservative management and continues to have progressively worsening pain.  The have elected to proceed with   right total knee arthroplasty with computer navigation. Further conservative treatment would not be expected to significantly improve symptoms.    Risks, benefits, and alternatives have been reviewed. The risks include, but are not limited to, bleeding, infection, neurovascular injury, persistent or worsening pain, instability, stiffness, fracture, loosening, wear, loss of limb, DVT, PE, and death. Benefits include potentially improved pain and function. Alternatives include rest, activity modification, PT, medication, injections.  The patient has indicated their understanding and agrees to proceed. Informed consent has been obtained and placed on the chart.

## 2020-08-19 NOTE — Brief Op Note (Signed)
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 Item Serial No. Manufacturer Lot No. LRB No. Used Action   CEMENT COBALT HV HI CNTRST 40G - [id number] Cement CEMENT COBALT HV HI CNTRST 40G  [employer] [id number] Right 1 Implanted   CEMENT COBALT HV HI CNTRST 40G - [id number] Cement CEMENT COBALT HV HI CNTRST 40G  [employer] [id number] Right 1 Implanted   BASEPLT TRIATH TIB SZ3 - [id number] Base BASEPLT TRIATH TIB SZ3  [employer] [id number] Right 1 Implanted   COMP TRIATH CR FMRL CMT SZ3 RT - [id number] Femoral COMP TRIATH CR FMRL CMT SZ3 RT  [employer] [id number] Right 1 Implanted   PATELLA ASYMMETRIC A3E - [id number] Joint PATELLA ASYMMETRIC A3E  [employer] [id number] Right 1 Implanted   INSERT TRIATH CR SZ3 - [id number] Insert INSERT TRIATH CR SZ3  [employer] [id number] Right 1 Implanted       Drains:   Drains: no    Specimens:   * No specimens in log *      Findings:   Osteoarthritis    Complications:   None      Signed by: Cathey Endow, MD                                                                            MAIN OR

## 2020-08-19 NOTE — PT Eval Note (Signed)
[facility]  Physical Therapy Evaluation    Patient: [name] MRN: [id number]   Unit: 5NEW ORTHOPEDICS    Bed: K530/K530-01    Attention Providers:  For patients in an observation or outpatient status, Medicare requires the ordering provider to certify that this service is medically necessary.  Please co-sign this evaluation to indicate your agreement.  Thank you.        Recommendation  Discharge Recommendation: Home with outpatient PT  PT - Next Visit Recommendation: 08/20/20  PT Frequency: 7x/wk      Recommended (non-medical) mode of transportation at discharge:  Car    PMP - Progressive Mobility Protocol   PMP Activity: Step 7 - Walks out of Room  Distance Walked (ft) (Step 6,7): 100 Feet      Evaluation:   Consult received for [name] for PT evaluation and treatment.  Chart reviewed.  Patient's medical condition is appropriate for Physical Therapy intervention at this time.     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 female admitted on 08/19/2020 with   Procedure(s):  ARTHROPLASTY, R KNEE, TOTAL, COMPUTER NAVIGATION    Day of Surgery  -------------------         Patient Active Problem List   Diagnosis    Primary osteoarthritis of right knee    Post-operative nausea and vomiting    Hypertension    Asthma    Gastroesophageal reflux disease    Anxiety    Osteoarthritis of right knee, unspecified osteoarthritis type     Past Medical History:   Diagnosis Date    Anxiety     situational, PCP provided xanax x 3 nights    Arthritis     right knee, hips, lower back      Asthma     well controlled    Breast CA 06/20/2012    Breast cancer 2012    left lumpectomy with radiation tx    Gastroesophageal reflux disease     mild intermittent    Has immunity to COVID-19 virus 07/30/2020    Pfizer x3    Hx of breast cancer 05/01/2014    Left 1 cm grade 1, ER positive, SLN negative     Hypertension     controlled with med.    Low back pain     Post-operative nausea and vomiting     Primary osteoarthritis of one knee, left 12/21/2016    Primary osteoarthritis of right knee     c/o right knee pain (dull ache), causing restrictive moment & affecting activities. Tried PT & injections with temporary relief. Indication for surgery    Seasonal allergies      Past Surgical History:   Procedure Laterality Date    ABCESS DRAINAGE      from buttock    ARTHROPLASTY, KNEE, TOTAL, COMPUTER NAVIGATION Left 12/21/2016    Procedure: ARTHROPLASTY, KNEE, TOTAL, COMPUTER NAVIGATION;  Surgeon: Cathey Endow, MD;  Location: [place] [facility];  Service: Orthopedics;  Laterality: Left;    BREAST SURGERY  Left 01/2011    well diff CA 0.8 cm Grade 1 (T1) N0MX  ER +    D&C WITH HYSTEROSCOPY      EGD, COLONOSCOPY N/A 01/19/2018    Procedure: EGD, COLONOSCOPY;  Surgeon: Caleb Popp, MD;  Location: [facility];  Service: Gastroenterology;  Laterality: N/A;  EGD, COLONOSCOPY W/IVA    EYE SURGERY Left     cataract    HYSTERECTOMY  1992    JOINT REPLACEMENT           X-Rays/Tests/Labs:  Radiology Results (24 Hour)     Procedure Component Value Units Date/Time    XR Knee 1 Or 2 Views Right [[id number]] Collected: 08/19/20 1615    Order Status: Completed Updated: 08/19/20 1618    Narrative:      HISTORY: Postop.    COMPARISON: None.    FINDINGS:  Frontal and lateral views of the right knee are provided.    The patient is status post total knee arthroplasty with patellar  resurfacing. The alignment is near-anatomic. There is no evidence of  acute displaced fracture. There are postsurgical changes, including  foci  of gas in and adjacent to the joint.      Impression:        1.  Postsurgical changes related to right total knee arthroplasty with  near-anatomic alignment.    Carla Drape, MD   08/19/2020 4:16 PM    US GUIDED NERVE BLOCK FOR ANESTHESIA [[id number]] Resulted: 08/19/20 0842    Order Status: Completed Updated: 08/19/20 0842    Narrative:      An ultrasound-guided nerve block was performed. For details regarding this   procedure please refer to the anesthesia record.                    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   Pain Assessment  Pain Assessment: Numeric Scale (0-10)  Pain Score: 4-moderate pain  Pain Location: Knee  Pain Orientation: Right  Pain Descriptors: Aching  Pain Frequency: Increases with movement  Pain Intervention(s): Cold applied                  Objective:  Patient is in bed with no medical equipment in place.    Observation of patient/vitals     Inspection/Posture  Inspection/Posture: Return home  Cognition/Neuro Status  Arousal/Alertness: Appropriate responses to stimuli  Attention Span: Appears intact  Orientation Level: Oriented X4  Memory: Appears intact  Following Commands: Follows all commands and directions without difficulty  Safety Awareness: independent  Insights: Fully aware of deficits  Problem Solving: Able to problem solve independently  Behavior: attentive; calm; cooperative        Musculoskeletal Examination  Gross ROM  Right Lower  Extremity ROM:  (0-40 degrees)  Left Lower Extremity ROM: within functional limits  Gross Strength  Right Lower Extremity Strength: 2/5  Left Lower Extremity Strength: within functional limits                Sensation: Grossly intact      Functional Mobility  Rolling: Independent  Supine to Sit: Independent  Scooting to EOB: Independent  Sit to Stand: Minimal Assist  Stand to Sit: Minimal Assist      Transfers  Bed to Chair: Minimal Assist  Device Used for Functional Transfer: front-wheeled walker  Balance  Sitting - Static:  (Independent )  Sitting - Dynamic:  (Independent )  Standing - Static:  (CGA x 1)  Standing - Dynamic:  (Min A x 1)                 AM-PAC Basic Mobility  Turning Over in Bed: None  Sitting Down On/Standing From Armchair: A little  Lying on Back to Sitting on Side of Bed: None  Assist Moving to/from Bed to Chair: A little  Assist to Walk in Hospital Room: A little  Assist to Climb 3-5 Steps with Railing: Total  PT Basic Mobility Raw Score: 18    Locomotion  Ambulation: Minimal Assist; with front-wheeled walker (100')  Pattern: R decreased stance time; decreased cadence; decreased step length      Participation and Endurance  Participation Effort: excellent  Endurance: Tolerates 30 min exercise with multiple rests           Assessment:    Assessment: Decreased LE ROM; Decreased LE strength; Decreased endurance/activity tolerance; Decreased functional mobility; Decreased balance; Gait impairment      Patient History: (Low = none; Mod = 1-2; High = 3 or more)   Personal factors and pre-existing co-morbidities affecting plan of care include chronic illness(Back Pain, HTN) and home environment(Steps)    Examination: (Low = 1-2; Mod = 3 or more; High = 4 or more)   Plan of care will address impairments in the follow body systems: pain, range of motion, strength, function, gait, balance and endurance as demonstrated by these objective measures:  pain scale, manual muscle test, AM-PAC and gait analysis    Clinical presentation is stable.  Plan of care complexity impacted by situation that requires coordination with other healthcare professionals for discharge needs        Prognosis: Good; With continued PT status post acute discharge        Plan:       Patient Goal: Return home     Risks/Benefits/POC Discussed with Pt/Family: With patient/family       Treatment/Interventions: Exercise; Gait training; Stair training; Neuromuscular re-education; Functional transfer training; LE strengthening/ROM; Endurance training; Cognitive reorientation; Patient/family training; Equipment eval/education; Bed mobility         Goals  Goal Formulation: With patient/family  Time for Goal Acheivement: 5 visits  Goals: Select goal  Pt Will Perform Sit to Stand: independent  Pt Will Ambulate: 101-150 feet; with rolling walker; independent  Pt Will Go Up / Down Stairs: 3-5 stairs; with minimal assist; With rail; With SPC  Pt Will Perform Home Exer Program: with minimal assist         Interdisciplinary Communication:   Pt left up in chair with alarm activated.  Updated white communication board in room with patient's current mobility status.  Communicated with Lani, RN via FTF regarding pt's functional mobility status.  Patient's family present for PT evaluation.      Education:   Educated patient and family to role of physical  therapy, importance of participation in PT, plan of care, transfer training techniques, falls prevention, gait training techniques with RW, importance of calling for help to get up,  home safety, next appropriate level of care.      Patient and family demonstrated good understanding.      Discussed goals of therapy with patient and family, in agreement with plan.    Time Calculation  PT Received On: 08/19/20  Start Time: 1615  Stop Time: 1645  Time Calculation (min): 30 min          Signature:  Jacqulyn Cane, PT, DPT

## 2020-08-19 NOTE — Anesthesia Preprocedure Evaluation (Signed)
Anesthesia Evaluation    AIRWAY    Mallampati: II    TM distance: >3 FB  Neck ROM: full  Mouth Opening:full  Planned to use difficult airway equipment: No CARDIOVASCULAR    cardiovascular exam normal and regular       DENTAL    no notable dental hx     PULMONARY    pulmonary exam normal     OTHER FINDINGS                  ===============================================================    ADDITIONAL MEDICAL HISTORY      No LMP recorded. Patient has had a hysterectomy.    Body mass index is 22.49 kg/m.    Stop-Bang Score: 3                Scheduled  Procedure(s):  ARTHROPLASTY, KNEE, TOTAL, COMPUTER NAVIGATION  Attending Physician: Cathey Endow, MD  Admission Date:08/19/2020      Assessment:      Patient Active Problem List   Diagnosis    Primary osteoarthritis of right knee    Post-operative nausea and vomiting    Hypertension    Asthma    Gastroesophageal reflux disease    Anxiety          Past Medical History:   Diagnosis Date    Anxiety     situational, PCP provided xanax x 3 nights    Arthritis     right knee, hips, lower back     Asthma     well controlled    Breast CA 06/20/2012    Breast cancer 2012    left lumpectomy with radiation tx    Gastroesophageal reflux disease     mild intermittent    Has immunity to COVID-19 virus 07/30/2020    Pfizer x3    Hx of breast cancer 05/01/2014    Left 1 cm grade 1, ER positive, SLN negative     Hypertension     controlled with med.    Low back pain     Post-operative nausea and vomiting     Primary osteoarthritis of one knee, left 12/21/2016    Primary osteoarthritis of right knee     c/o right knee pain (dull ache), causing restrictive moment & affecting activities. Tried PT & injections with temporary relief. Indication for surgery    Seasonal allergies             Past Surgical History:   Procedure Laterality Date    ABCESS DRAINAGE      from buttock    ARTHROPLASTY, KNEE, TOTAL, COMPUTER NAVIGATION Left 12/21/2016    Procedure: ARTHROPLASTY, KNEE,  TOTAL, COMPUTER NAVIGATION;  Surgeon: Cathey Endow, MD;  Location: [place] [facility];  Service: Orthopedics;  Laterality: Left;    BREAST SURGERY Left 01/2011    well diff CA 0.8 cm Grade 1 (T1) N0MX  ER +    D&C WITH HYSTEROSCOPY      EGD, COLONOSCOPY N/A 01/19/2018    Procedure: EGD, COLONOSCOPY;  Surgeon: Caleb Popp, MD;  Location: [facility];  Service: Gastroenterology;  Laterality: N/A;  EGD, COLONOSCOPY W/IVA    EYE SURGERY Left     cataract    HYSTERECTOMY  1992    JOINT REPLACEMENT         Social History     Social History     Tobacco Use    Smoking status: Never Smoker    Smokeless tobacco: Never Used  Substance Use Topics    Alcohol use: No      Allergies:     Allergies   Allergen Reactions    Erythromycin Itching and Rash    Morphine Itching     Morphine causes severe itching "bugs crawling on skin"    Pt can tolerate oxycodone    Penicillins Rash     Pt states she has taken Keflex before without any problems    Tetracycline Rash    Codeine Other (See Comments)     unknown    Other     Wound Dressing Adhesive Other (See Comments)     contrindicated d/t thin skin    Contrast [Iodinated Diagnostic Agents] Itching and Rash     Able to eat shellfish    Levofloxacin Rash    Naprosyn [Naproxen] Rash    Sulfa Antibiotics Itching and Rash     Hospital Medications:     Current Facility-Administered Medications   Medication Dose Route Frequency    midazolam  2 mg Intravenous Once    tranexamic acid  1,000 mg Intravenous On Call to OR     Home Medications:     Medications Prior to Admission   Medication Sig Dispense Refill Last Dose    acetaminophen (TYLENOL) 500 MG tablet Take 500 mg by mouth every 6 (six) hours as needed.       08/18/2020 at pm    ALPRAZolam (XANAX) 1 MG tablet Take 0.5 mg by mouth nightly as needed      08/19/2020 at am    aspirin EC 81 MG EC tablet Take 81 mg by mouth nightly   Past Week at am    Breo Ellipta 100-25 MCG/INH Aerosol Pwdr, Breath Activated 1 puff  daily   08/19/2020 at am    calcium carb-cholecalciferol (Caltrate 600+D3) 600-800 MG-UNIT Tab Take 1 tablet by mouth 2 (two) times daily   08/18/2020 at am    Calcium Carbonate-Vitamin D (CALTRATE 600+D PO) Take 600 mg by mouth 2 (two) times daily      08/18/2020 at am    ciclopirox (PENLAC) 8 % solution Apply topically nightly Apply over nail and surrounding skin. Apply daily over previous coat. After seven (7) days, may remove with alcohol and continue cycle.  To 2 toes on right   08/18/2020 at pm    escitalopram (LEXAPRO) 5 MG tablet Take 10 mg by mouth every evening      08/18/2020 at pm    famotidine (PEPCID) 40 MG tablet Take 40 mg by mouth daily AC dinner   08/18/2020 at pm    ibuprofen (ADVIL,MOTRIN) 200 MG tablet Take 200 mg by mouth as needed for Pain   Past Week at am    Lactobacillus (PROBIOTIC ACIDOPHILUS PO) Take by mouth daily   08/18/2020 at am    lansoprazole (PREVACID) 30 MG capsule Take 30 mg by mouth daily   08/19/2020 at am    loratadine (CLARITIN) 10 MG tablet Take 10 mg by mouth every evening       08/18/2020 at pm    losartan (COZAAR) 100 MG tablet Take 100 mg by mouth daily   08/18/2020 at am    montelukast (SINGULAIR) 10 MG tablet Take 10 mg by mouth nightly.   08/18/2020 at pm    senna-docusate (PERICOLACE) 8.6-50 MG per tablet Take 2 tablets by mouth 2 (two) times daily as needed for Constipation. (Patient taking differently: Take 2 tablets by mouth daily   ) 100 tablet  0 08/18/2020 at pm    denosumab (Prolia) 60 MG/ML Solution Prefilled Syringe subcutaneous injection Inject 60 mg into the skin once Every 6 months   More than a month at am    oxyCODONE (ROXICODONE) 5 MG immediate release tablet TAKE 1-2 TABLETS BY MOUTH EVERY FOUR HOURS as NEEDED FOR PAIN   post op           Vitals:   There were no vitals filed for this visit.    Labs:     Results     ** No results found for the last 24 hours. **               Lab Results   Component Value Date    HGB 11.0 (L) 12/26/2016    PLT 192  12/26/2016       Lab Results   Component Value Date    NA 141 12/26/2016    CL 106 12/26/2016    CO2 24 12/26/2016    K 4.1 12/26/2016    CREAT 0.6 12/26/2016    BUN 17 12/26/2016    GLU 102 (H) 12/26/2016       No results found for: AST, ALT, TROPI    No results found for: PT, PTT, INR    Lab Results   Component Value Date    COVID Nasal Swab 11/17/2019    COVID Not Detected 11/17/2019       Rads:     Radiology Results (24 Hour)     Procedure Component Value Units Date/Time    US GUIDED NERVE BLOCK FOR ANESTHESIA [[id number]] Resulted: 08/19/20 0842    Order Status: Completed Updated: 08/19/20 0842    Narrative:      An ultrasound-guided nerve block was performed. For details regarding this   procedure please refer to the anesthesia record.                      Relevant Problems   ANESTHESIA   (+) Post-operative nausea and vomiting      CARDIO   (+) Hypertension      GI   (+) Gastroesophageal reflux disease       PSS Anesthesia Comments: See detailed PEC NP H&P from 08/13/2020-[facility]        Anesthesia Plan    ASA 3     spinal               (Risks discussed including but not limited to:    Spinal risks reviewed including but not limited to infection, bleeding, nerve damage, spinal headache.     - Neurological complications such as stroke, TIA  - Cardiovascular complications such as heart attack, Arrhythmias, Cardiac arrest   - Pulmonary complications such as Asthmatic attack,Pulm. Aspiration, Bronchospasm and Pneumonia  - Intra-operative awareness,  - Dental Injuries  - Sore Throat.  - Allergic reactions.  - Death.     Anesthesia explained and Questions answered.   Discussed regional anesthesia risks and benefits. Patient agreed to move forward with it.  Pt understands and wishes to proceed.    Kathleen Argue, MD, MD    )      intravenous induction   Detailed anesthesia plan: PNB, spinal and general IV        Post op pain management: per surgeon and PNB single shot    informed consent obtained    Plan discussed with CRNA  and attending.  Signed by: Kathleen Argue, MD 08/19/20 9:00 AM

## 2020-08-19 NOTE — Plan of Care (Signed)
Problem: Safety  Goal: Patient will be free from injury during hospitalization  Outcome: Progressing  Flowsheets (Taken 08/19/2020 1556)  Patient will be free from injury during hospitalization:   Assess patient's risk for falls and implement fall prevention plan of care per policy   Use appropriate transfer methods   Include patient/ family/ care giver in decisions related to safety   Ensure appropriate safety devices are available at the bedside   Provide and maintain safe environment   Hourly rounding   Assess for patients risk for elopement and implement Elopement Risk Plan per policy  Goal: Patient will be free from infection during hospitalization  Outcome: Progressing  Flowsheets (Taken 08/19/2020 1556)  Free from Infection during hospitalization:   Assess and monitor for signs and symptoms of infection   Monitor all insertion sites (i.e. indwelling lines, tubes, urinary catheters, and drains)   Encourage patient and family to use good hand hygiene technique     Problem: Pain  Goal: Pain at adequate level as identified by patient  Flowsheets (Taken 08/19/2020 1556)  Pain at adequate level as identified by patient:   Identify patient comfort function goal   Assess for risk of opioid induced respiratory depression, including snoring/sleep apnea. Alert healthcare team of risk factors identified.   Assess pain on admission, during daily assessment and/or before any "as needed" intervention(s)   Evaluate patient's satisfaction with pain management progress   Evaluate if patient comfort function goal is met   Reassess pain within 30-60 minutes of any procedure/intervention, per Pain Assessment, Intervention, Reassessment (AIR) Cycle   Offer non-pharmacological pain management interventions   Include patient/patient care companion in decisions related to pain management as needed     Problem: Side Effects from Pain Analgesia  Goal: Patient will experience minimal side effects of analgesic therapy  Outcome:  Progressing  Flowsheets (Taken 08/19/2020 1556)  Patient will experience minimal side effects of analgesic therapy:   Monitor/assess patient's respiratory status (RR depth, effort, breath sounds)   Prevent/manage side effects per LIP orders (i.e. nausea, vomiting, pruritus, constipation, urinary retention, etc.)   Evaluate for opioid-induced sedation with appropriate assessment tool (i.e. POSS)   Assess for changes in cognitive function     Problem: Discharge Barriers  Goal: Patient will be discharged home or other facility with appropriate resources  Outcome: Progressing  Flowsheets (Taken 08/19/2020 1556)  Discharge to home or other facility with appropriate resources: Provide appropriate patient education     Problem: Psychosocial and Spiritual Needs  Goal: Demonstrates ability to cope with hospitalization/illness  Outcome: Progressing  Flowsheets (Taken 08/19/2020 1556)  Demonstrates ability to cope with hospitalizations/illness:   Encourage verbalization of feelings/concerns/expectations   Assist patient to identify own strengths and abilities   Encourage participation in diversional activity   Include patient/ patient care companion in decisions   Encourage patient to set small goals for self   Reinforce positive adaptation of new coping behaviors     Problem: Moderate/High Fall Risk Score >5  Goal: Patient will remain free of falls  Outcome: Progressing  Flowsheets (Taken 08/19/2020 1556)  High (Greater than 13): HIGH-Bed alarm on at all times while patient in bed     Problem: Knee Surgery  Goal: Hemodynamic Stability  Outcome: Progressing  Flowsheets (Taken 08/19/2020 1556)  Hemodynamic stability:   Monitor/assess vital signs   Monitor intake and output.  Notify LIP if urine output is less than 30 ml/hr.   Maintain temperature within desired parameters   Monitor SpO2 and treat  as needed   Monitor/assess surgical drainage output if drain is present   Monitor/assess lab values and report abnormal values  Goal: Pain  at adequate level as identified by patient  Outcome: Progressing  Flowsheets (Taken 08/19/2020 1556)  Pain at adequate level as identified by patient:   Identify patient comfort function goal   Evaluate if patient comfort function goal is met   Reposition patient every 2 hours and PRN unless able to self-reposition   Administer analgesics as prescribed to achieve pain goal  Goal: Stable Neurovascular Status  Outcome: Progressing  Flowsheets (Taken 08/19/2020 1556)  Stable neurovascular status:   Assess and document plantar/dorsiflexion every 4 hours   Monitor/assess neurovascular status (pulses, capillary refill, pain, paresthesia, presence of edema)   VTE prevention: administer anticoagulant(s) and/or apply anti-embolism stockings/devices as ordered   Monitor/assess for return of sensation after nerve block therapy if indicated  Goal: Free from Infection  Outcome: Progressing  Flowsheets (Taken 08/19/2020 1556)  Free from infection:   Monitor/assess vital signs   Maintain temperature within desired parameters   Assess for signs and symptoms of infection   Assess surgical dressing, reinforce or change as needed per order   Teach/reinforce use of incentive spirometer 10 times per hour while awake, cough and deep breath as needed  Goal: Mobility/activity is maintained at optimum level for patient  Outcome: Progressing  Flowsheets (Taken 08/19/2020 1556)  Mobility/activity is maintained at optimum level for patient:   Administer analgesics as prescribed to achieve pain goal   Utilize special equipment (trapeze, abduction pillow, regular pillow, walker) as needed and as ordered   Review weight bearing status with patient/patient care companion   Teach/review/reinforce knee precautions with patient/patient care companion (no pillow under knee, lock out knee flexion feature on bed)   Dangle/stand at bedside if indicated with assistance as needed   Ambulate equal to or greater than 50 feet   Out of bed to chair if indicated  with assistance as needed   Teach/review/reinforce exercises (ankle pumps, quad sets, gluteal sets)  Goal: Patient will maintain normal GI status  Outcome: Progressing  Flowsheets (Taken 08/19/2020 1556)  Patient will maintain normal GI status:   Assess for nausea and/or vomiting. Provide pharmacological and/or non-pharmacological support as needed.   Assess for normal bowel sounds   Administer stool softener as prescribed   Advance diet as ordered and tolerated   Assess and document if patient is passing gas, each shift.

## 2020-08-19 NOTE — Discharge Instr - AVS First Page (Addendum)
Reason for your Hospital Admission:  S/p TKA      Instructions for after your discharge:  Instructions for after your discharge:   Post-Op Instructions  Total Knee Arthroplasty  [name]. Anise Salvo, MD  [facility], [place]  [phone]    Activity  Do not drive until cleared by your surgeon.  Never drive while taking opioid pain medication.  You can weight bear as tolerated (unless otherwise indicated). Gradually wean from walker to a cane.  We encourage you to walk as much as your feel comfortable.  Increasing your mobility will help to regain strength and function, and will help reduce the risk of blood clots.  When resting, keep your ankle elevated above the level of your heart. This helps keep swelling down. It is important to keep the knee straight when elevating. You can place pillows beneath the calf and ankle to keep the knee straight. DO NOT put pillows behind the knee only, as this will cause the knee to bend and can lead to a flexion contracture.  Another alternative is a wedge pillow. You can purchase a wedge pillow online.   Use the knee immobilizer for ambulation until able to perform a straight leg raise (lifting the leg straight up while laying on your back).  Perform ankle pump exercises (bend your foot up and down) as much as possible when resting.  Practice flexing and extending your knee to improve range of motion.  A floor-based pedal exercise machine can be used to perform range of motion exercises on your own.  They are very affordable and can be purchased online.  Check [employer].com and search for pedal exerciser.    Post-Op Rehabilitation/Physical Therapy  A home exercise program has been provided (Home Therapy after Total Knee Arthroplasty). Please start these exercises on the day after you arrive home.  If you would like to do outpatient physical therapy with a therapist, we recommend that you start this after your 2-week post-op visit in our office (unless otherwise  instructed). Physical therapy can be performed at either [facility] location [facility] or [place]) or at any location of your choosing.  If you have elected to use the [employer] [facility] app, you can start this program instead of the home exercise packet referenced above. Post-op exercises will be sent to you through the program. If you have any difficulty accessing the app, please contact our office for assistance.  A floor-based pedal exercise machine can be used to perform range of motion exercises on your own.  They are very affordable and can be purchased online.  Check [employer] and search for pedal exerciser.    Incision Care  You have a waterproof dressing in place.  You can shower with the waterproof dressing in place once the nerve block catheter is removed.  An Ace wrap can be placed around the waterproof dressing to help with swelling. You can remove and replace as necessary. Do not wrap the Ace wrap too tight.  Please leave the waterproof dressing in place until seen at your follow-up appointment.  Ice application helps reduce swelling. Use an ice pack wrapped in a thin towel to reduce the swelling. Keep the foot elevated while you ice the knee. Apply the ice pack for 20 minutes; then remove it for 20 minutes. Repeat as needed.  A cold therapy machine can be used instead of an ice pack if available.    Medication  Your prescriptions were provided either at  your pre-op appointment or at the time of discharge.  Some of the medications below may not have been prescribed depending on your medical history or medication allergies.  Your medications will be verified at the time of discharge. Please contact Dr Anise Salvo' office with any questions regarding medications.  Take your medication only as prescribed.  Do not drink alcohol while taking pain medication.  If you need any pain medication refills, please contact the office 48 hours prior to running out of the medication.  Refills are not provided on weekends by the on-call physician. If you are going to run out of medication over the weekend, please contact the office on Thursday or Friday to obtain refills.    Celecoxib (Celebrex) 200 mg  Take once daily  An anti-inflammatory that helps with pain and swelling    Gabapentin (Neurontin) 300 mg  Take twice daily, every morning and evening  A medication for nerve pain  May cause drowsiness    Oxycodone 5 mg  Take 1-2 tablets every 4 hours as needed for pain  A narcotic pain medication that is taken on an AS NEEDED basis  We recommend that you gradually wean off this medication. Prolonged use can lead to dependence  Narcotic pain medication, such as oxycodone, can cause multiple side effects including, but not limited to, drowsiness, altered mental status, respiratory depression (difficulty breathing), depression and constipation.    Aspirin 81 mg  Take twice daily, every morning and evening  Take for 30 days after surgery  Can be purchased over the counter  Used to help prevent blood clots  If you are considered to be at high risk for blood clots, or if you are already on another anticoagulant (blood thinner) medication, you will be prescribed another medication or asked to continue your current medication. Such medications include warfarin, Eliquis, Xarelto, and Lovenox.    Acetaminophen (Tylenol)  Take 1000 mg every 8 hours as needed for pain and/or fever  Can be purchased over the counter  Do not exceed 3000 mg of acetaminophen per day, and avoid alcohol if taking regularly    Docusate (Colace)  Take twice daily, every morning and evening, for constipation  Can be purchased over the counter  Discontinue use if you experience loose stool.    Cefuroxime (Ceftin)  Depending upon your medical history, Dr Anise Salvo may have prescribed an antibiotic to take after surgery  Take twice daily, every morning and evening.    Swelling  Swelling is normal after surgery and is part of the inflammatory  healing process. Ice pack application (or cold therapy machine) and elevation will help with swelling. Also, it is completely normal and expected to have bruising in the leg. The bruising can extend from the thigh to the foot.    Constipation  Constipation is one of the most common side effects of surgery and narcotic pain medication. We have prescribed Colace.  It can be taken twice daily as prescribed. Discontinue use if you experience loose stool.   If you are still experiencing constipation despite the stool softener and laxatives, you can try other over-the-counter options including use of a Fleets enema or magnesium citrate.     Blood Clot Prevention  Blood clots are a risk of any lower extremity joint replacement surgery.  Prevention of blood clots is very important and we recommend multiple modalities.  Medication- Dr. Anise Salvo team will prescribe a medication to help prevent blood clots.  Most commonly, we use aspirin 81 mg by  mouth twice daily for 4 weeks. Sometimes, when people are higher risk for blood clots, we will prescribe a stronger anticoagulant such as Lovenox, Eliquis, Xarelto, or warfarin.  If you are already on an anticoagulant, we will likely resume this after surgery.  Mobilization- One of the best ways to decrease the risk of blood clots is to get up and move.  We recommend walking frequently throughout the day in addition to doing ankle/leg pumps while lying in bed or sitting down.  Notify Dr Anise Salvo' office if you experience increased calf pain or swelling, as these can be signs of a blood clot.    Other Precautions  Keep important household items you need within reach.  Remove throw rugs, electrical cords, and anything else that may cause you to fall or trip.  Free up your hands so that you can use them to keep balance. Use a fanny pack, apron, or pockets to carry things.    Follow-up  Follow-up at Dr. Anise Salvo' office at scheduled appointment 2 weeks post-op. Your first post-op appointment  will be with Dr Anise Salvo' physician assistant, Alycia Rossetti Teira Arcilla.  Your post-op visits have been scheduled by Dr Anise Salvo' office. Please call with any questions.    Notify Dr. Anise Salvo for any increased bleeding, redness, drainage, swelling, worsening pain, calf pain or swelling, or other concerning symptoms.     Dr. Anise Salvo office number is [phone].      Report to the emergency room immediately for any chest pain or shortness of breath.

## 2020-08-20 ENCOUNTER — Encounter: Payer: Self-pay | Admitting: Acute Care

## 2020-08-20 ENCOUNTER — Encounter: Payer: Self-pay | Admitting: Orthopaedic Surgery

## 2020-08-20 LAB — BASIC METABOLIC PANEL
Anion Gap: 5 (ref 5.0–15.0)
BUN: 13 mg/dL (ref 7–19)
CO2: 23 mEq/L (ref 22–29)
Calcium: 7.8 mg/dL — ABNORMAL LOW (ref 7.9–10.2)
Chloride: 106 mEq/L (ref 100–111)
Creatinine: 0.6 mg/dL (ref 0.6–1.0)
Glucose: 122 mg/dL — ABNORMAL HIGH (ref 70–100)
Potassium: 4 mEq/L (ref 3.5–5.1)
Sodium: 134 mEq/L — ABNORMAL LOW (ref 136–145)

## 2020-08-20 LAB — GFR: EGFR: >60

## 2020-08-20 LAB — HEMOGLOBIN AND HEMATOCRIT, BLOOD
Hematocrit: 36.4 % (ref 34.7–43.7)
Hgb: 11.6 g/dL (ref 11.4–14.8)

## 2020-08-20 NOTE — Plan of Care (Signed)
Pt Aox4 on RA, VSS. R knee wrapped in ace bandage and immobilizer when OOB. Change ice packs q4. Neurovascular q4. Pain and N/V managed with PRN, passing gas but has not had a BM since surgery. Voiding adequately. Uses walker, ambulated in hall. Using IS, wearing SCDs, bed locked and in lowest position, call bell within reach.      Problem: Safety  Goal: Patient will be free from injury during hospitalization  Outcome: Progressing  Flowsheets (Taken 08/19/2020 1556 by Eusebio Me, RN)  Patient will be free from injury during hospitalization:   Assess patient's risk for falls and implement fall prevention plan of care per policy   Use appropriate transfer methods   Include patient/ family/ care giver in decisions related to safety   Ensure appropriate safety devices are available at the bedside   Provide and maintain safe environment   Hourly rounding   Assess for patients risk for elopement and implement Elopement Risk Plan per policy  Goal: Patient will be free from infection during hospitalization  Outcome: Progressing  Flowsheets (Taken 08/19/2020 1556 by Eusebio Me, RN)  Free from Infection during hospitalization:   Assess and monitor for signs and symptoms of infection   Monitor all insertion sites (i.e. indwelling lines, tubes, urinary catheters, and drains)   Encourage patient and family to use good hand hygiene technique     Problem: Pain  Goal: Pain at adequate level as identified by patient  Outcome: Progressing  Flowsheets (Taken 08/19/2020 1556 by Eusebio Me, RN)  Pain at adequate level as identified by patient:   Identify patient comfort function goal   Assess for risk of opioid induced respiratory depression, including snoring/sleep apnea. Alert healthcare team of risk factors identified.   Assess pain on admission, during daily assessment and/or before any "as needed" intervention(s)   Evaluate patient's satisfaction with pain management progress   Evaluate if patient comfort function  goal is met   Reassess pain within 30-60 minutes of any procedure/intervention, per Pain Assessment, Intervention, Reassessment (AIR) Cycle   Offer non-pharmacological pain management interventions   Include patient/patient care companion in decisions related to pain management as needed     Problem: Side Effects from Pain Analgesia  Goal: Patient will experience minimal side effects of analgesic therapy  Outcome: Progressing  Flowsheets (Taken 08/19/2020 1556 by Eusebio Me, RN)  Patient will experience minimal side effects of analgesic therapy:   Monitor/assess patient's respiratory status (RR depth, effort, breath sounds)   Prevent/manage side effects per LIP orders (i.e. nausea, vomiting, pruritus, constipation, urinary retention, etc.)   Evaluate for opioid-induced sedation with appropriate assessment tool (i.e. POSS)   Assess for changes in cognitive function     Problem: Discharge Barriers  Goal: Patient will be discharged home or other facility with appropriate resources  Outcome: Progressing  Flowsheets (Taken 08/19/2020 1556 by Eusebio Me, RN)  Discharge to home or other facility with appropriate resources: Provide appropriate patient education     Problem: Psychosocial and Spiritual Needs  Goal: Demonstrates ability to cope with hospitalization/illness  Outcome: Progressing  Flowsheets (Taken 08/19/2020 1556 by Eusebio Me, RN)  Demonstrates ability to cope with hospitalizations/illness:   Encourage verbalization of feelings/concerns/expectations   Assist patient to identify own strengths and abilities   Encourage participation in diversional activity   Include patient/ patient care companion in decisions   Encourage patient to set small goals for self   Reinforce positive adaptation of new coping behaviors     Problem: Moderate/High Fall Risk  Score >5  Goal: Patient will remain free of falls  Outcome: Progressing  Flowsheets (Taken 08/20/2020 0500)  High (Greater than 13):   HIGH-Apply yellow  "Fall Risk" arm band   HIGH-Bed alarm on at all times while patient in bed     Problem: Knee Surgery  Goal: Hemodynamic Stability  Outcome: Progressing  Flowsheets (Taken 08/19/2020 1556 by Eusebio Me, RN)  Hemodynamic stability:   Monitor/assess vital signs   Monitor intake and output.  Notify LIP if urine output is less than 30 ml/hr.   Maintain temperature within desired parameters   Monitor SpO2 and treat as needed   Monitor/assess surgical drainage output if drain is present   Monitor/assess lab values and report abnormal values  Goal: Pain at adequate level as identified by patient  Outcome: Progressing  Flowsheets (Taken 08/19/2020 1556 by Eusebio Me, RN)  Pain at adequate level as identified by patient:   Identify patient comfort function goal   Evaluate if patient comfort function goal is met   Reposition patient every 2 hours and PRN unless able to self-reposition   Administer analgesics as prescribed to achieve pain goal  Goal: Stable Neurovascular Status  Outcome: Progressing  Flowsheets (Taken 08/19/2020 1556 by Eusebio Me, RN)  Stable neurovascular status:   Assess and document plantar/dorsiflexion every 4 hours   Monitor/assess neurovascular status (pulses, capillary refill, pain, paresthesia, presence of edema)   VTE prevention: administer anticoagulant(s) and/or apply anti-embolism stockings/devices as ordered   Monitor/assess for return of sensation after nerve block therapy if indicated  Goal: Free from Infection  Outcome: Progressing  Flowsheets (Taken 08/19/2020 1556 by Eusebio Me, RN)  Free from infection:   Monitor/assess vital signs   Maintain temperature within desired parameters   Assess for signs and symptoms of infection   Assess surgical dressing, reinforce or change as needed per order   Teach/reinforce use of incentive spirometer 10 times per hour while awake, cough and deep breath as needed  Goal: Mobility/activity is maintained at optimum level for  patient  Outcome: Progressing  Flowsheets (Taken 08/19/2020 1556 by Eusebio Me, RN)  Mobility/activity is maintained at optimum level for patient:   Administer analgesics as prescribed to achieve pain goal   Utilize special equipment (trapeze, abduction pillow, regular pillow, walker) as needed and as ordered   Review weight bearing status with patient/patient care companion   Teach/review/reinforce knee precautions with patient/patient care companion (no pillow under knee, lock out knee flexion feature on bed)   Dangle/stand at bedside if indicated with assistance as needed   Ambulate equal to or greater than 50 feet   Out of bed to chair if indicated with assistance as needed   Teach/review/reinforce exercises (ankle pumps, quad sets, gluteal sets)  Goal: Patient will maintain normal GI status  Outcome: Progressing  Flowsheets (Taken 08/19/2020 1556 by Eusebio Me, RN)  Patient will maintain normal GI status:   Assess for nausea and/or vomiting. Provide pharmacological and/or non-pharmacological support as needed.   Assess for normal bowel sounds   Administer stool softener as prescribed   Advance diet as ordered and tolerated   Assess and document if patient is passing gas, each shift.  Goal: Address patient self-management plan  Outcome: Progressing  Flowsheets (Taken 08/20/2020 0530)  Address patient self-management plan:   Promote lifestyle changes that support self-management activities   Evaluate barriers of lifestyle changes  Goal: Patient/Patient Care Companion demonstrates understanding of disease process, treatment plan, medications, and discharge plan  Outcome: Progressing

## 2020-08-20 NOTE — UM Notes (Signed)
** This review is compiled from documentation provided by the treatment team within the patient's medical record. **     Kelsey Litten, RN, BSN  Clinical Case Manager - Utilization Review  [facility]    [address]    [place], [place] [address]  NPI: [id number]  Tax ID: [id number]  Phone: [phone]  Fax: [phone]     Please use fax number or insurance line to provide authorization for hospital services or to request additional information.       DATE OF REVIEW: 11/8-11/9    Reason For Hospitalization:    Pt is a 79 y.o. female who presented for elective procedure.     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    Operative Procedure:   Procedure(s):  ARTHROPLASTY, KNEE, TOTAL, COMPUTER NAVIGATION  Estimated Blood Loss:    40 mL      XR Knee 1 Or 2 Views Right    Result Date: 08/19/2020  1.  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 Carla Drape, MD  08/19/2020 4:16 PM        PATIENT CLASS:  Patient placed to Ambulatory/OP on 08/19/2020    11/9:    Patient upgraded to INPT level of care    -Patient remains on surgical floor with continuous pulse oximetry monitoring  -Continued neurovascular checks and vital signs every 4 hours  -Patient with severe nausea and dizziness this AM when OOB  -PT/OT   -Pain management  -Medicine consult    Per ortho:  Assessment:   Stable post op course.  Slow ambulation progression      Plan:   Continue pain management, DVT prophylaxis, and therapy.  Systemic symptoms likely due to opioid use. Reduce to oxycodone 5 mg maximum per every 4 hours PRN  pain. Continue to monitor today.  Patient would benefit from an additional day of PT for balance and gait training.  Anticipate D/C to home tomorrow if pain remains well controlled, nausea and dizziness has resolved and patient is cleared by PT.    Scheduled Meds:  Current Facility-Administered Medications   Medication Dose Route Frequency    acetaminophen  1,000 mg Oral Q8H SCH    aspirin EC  81 mg Oral BID    calcium citrate-vitamin D  1 tablet Oral BID    cetirizine  10 mg Oral Daily    docusate sodium  100 mg Oral BID    escitalopram  10 mg Oral QPM    famotidine  20 mg Oral Q12H SCH    ferrous sulfate 324 mg-ascorbic acid 500 mg combo dose   Oral Daily    fluticasone furoate-vilanterol  1 puff Inhalation QAM    lactobacillus/streptococcus  1 capsule Oral Daily    losartan  100 mg Oral Daily    montelukast  10 mg Oral QHS    pantoprazole  40 mg Oral QAM AC    vitamins/minerals  1 tablet Oral Daily     Continuous Infusions:   sodium chloride 100 mL/hr at 08/20/20 0121    lactated ringers       metoclopramide (REGLAN) injection 5 mg  Dose: 5 mg x 1  ondansetron (ZOFRAN) injection 4 mg  Dose: 4 mg x 1     Ref. Range 08/20/2020 07:44   Sodium Latest Ref Range: 136 - 145 mEq/L 134 (L)      Ref. Range 08/20/2020 07:44   Calcium Latest Ref Range: 7.9 - 10.2 mg/dL 7.8 (L)

## 2020-08-20 NOTE — PT Progress Note (Signed)
Physical Therapy Note    [facility]  Physical Therapy Treatment    [address]  [place] [address]  [phone]    Patient:  [name] MRN#:  [id number]  Unit:  5NEW ORTHOPEDICS Room/Bed:  K530/K530-01    Time of treatment: Start Time: 1010 Stop Time: 1039  Time Calculation (min): 29 min  PT Received On: 08/20/20    Treatment # 2    Precautions  Weight Bearing Status: no restrictions  Total Knee Replacement: knee immobilizer; OOB  Other Precautions: Falls    Patient's medical condition is appropriate for Physical Therapy intervention at this time.     PPE worn: procedural mask, goggles  and gloves     Subjective:   Patient is agreeable to participation in the therapy session.  Pain: 5/10 R Knee    Objective:  Patient is seated in a recliner chair with no medical equipment in place.    Exercise: ankle pumps, quad sets, gluteal sets, straight leg raises, and hamstring stretch.    ROM:0-30 degrees    Bed Mobility:  Min A x 1    Sit to stand: Sup x 1 w/ verbal cues for technique, sequencing, hand placement, walker use, and rail use.      Ambulation: 100 feet using rolling walker with sup x 1 and verbal cues for technique, sequencing, walker use, posture, step length, speed, safety, and balance.    Stairs: not tested.    Education:  Educated the patient and coach in precautions, home safety, home exercise, use of ice, car and bathroom transfers. Demonstrated good understanding of all.    Assessment: Patient and coach instructed in total joint exercise program.  Required verbal and tactile cues for technique.        Goals  Pt Will Perform Sit to Stand: independent; Not met  Pt Will Ambulate: 101-150 feet; with rolling walker; independent; Not met  Pt Will Go Up / Down Stairs: 3-5 stairs; with minimal assist; With rail; With [facility]; Not met  Pt Will Perform Home Exer Program: with minimal assist; Goal met      Progress Toward Goals: good    Barriers to progress:pain.           Recommendation  Discharge Recommendation: Home with outpatient PT  PT - Next Visit Recommendation: 08/21/20  PT Frequency: 7x/wk    Recommended mode of transportation at discharge:   Car    PMP - Progressive Mobility Protocol   PMP Activity: Step 7 - Walks out of Room  Distance Walked (ft) (Step 6,7): 100 Feet     RN notified of session outcome.     Plan:   Continue plan of care.    Signature: Jacqulyn Cane, PT, DPT

## 2020-08-20 NOTE — Progress Notes (Signed)
PROGRESS NOTE    Date Time: 08/20/20 8:47 AM  Patient Name: [name],[name]  POD # : 1    Subjective:   Expected pain level currently 5/10 severity. Voiding w/o difficulty. + severe nausea and dizziness this AM when OOB. Slow with ambulation.    Objective:     Vitals:    08/20/20 0817   BP: 123/67   Pulse: 60   Resp: 16   Temp: 98.2 F (36.8 C)   SpO2: 96%       Gen: Alert and oriented, cooperative  Extremities:  Dressing w/ scant SS D/C, 2+ pedal pulses, full ROM with foot pumping and toe movement, 2+ edema, No calf tenderness  Neurological:  Sensation intact to light touch    Results     Procedure Component Value Units Date/Time    GFR [[id number]] Collected: 08/20/20 0744     Updated: 08/20/20 0835     EGFR >60.0    Basic Metabolic Panel [[id number]]  (Abnormal) Collected: 08/20/20 0744    Specimen: Blood Updated: 08/20/20 0835     Glucose 122 mg/dL      BUN 13 mg/dL      Creatinine 0.6 mg/dL      Calcium 7.8 mg/dL      Sodium 782 mEq/L      Potassium 4.0 mEq/L      Chloride 106 mEq/L      CO2 23 mEq/L      Anion Gap 5.0    Hemoglobin and hematocrit, blood [[id number]] Collected: 08/20/20 0744    Specimen: Blood Updated: 08/20/20 0753     Hgb 11.6 g/dL      Hematocrit 57.8 %     Type and Screen [[id number]] Collected: 08/19/20 0901    Specimen: Blood Updated: 08/19/20 0949     ABO Rh O NEG     AB Screen Gel NEG            Assessment:   Stable post op course.  Slow ambulation progression      Plan:   Continue pain management, DVT prophylaxis, and therapy.  Systemic symptoms likely due to opioid use. Reduce to oxycodone 5 mg maximum per every 4 hours PRN pain. Continue to monitor today.  Patient would benefit from an additional day of PT for balance and gait training.  Anticipate D/C to home tomorrow if pain remains well controlled, nausea and dizziness has resolved and patient is cleared by PT.    Signed by: Sol Blazing, PA

## 2020-08-20 NOTE — Plan of Care (Signed)
Problem: Safety  Goal: Patient will be free from injury during hospitalization  Outcome: Progressing  Goal: Patient will be free from infection during hospitalization  Outcome: Progressing     Problem: Pain  Goal: Pain at adequate level as identified by patient  Outcome: Progressing     Problem: Side Effects from Pain Analgesia  Goal: Patient will experience minimal side effects of analgesic therapy  Outcome: Progressing     Problem: Discharge Barriers  Goal: Patient will be discharged home or other facility with appropriate resources  Outcome: Progressing     Problem: Psychosocial and Spiritual Needs  Goal: Demonstrates ability to cope with hospitalization/illness  Outcome: Progressing     Problem: Moderate/High Fall Risk Score >5  Goal: Patient will remain free of falls  Outcome: Progressing     Problem: Knee Surgery  Goal: Hemodynamic Stability  Outcome: Progressing  Goal: Pain at adequate level as identified by patient  Outcome: Progressing  Goal: Stable Neurovascular Status  Outcome: Progressing  Goal: Free from Infection  Outcome: Progressing  Goal: Mobility/activity is maintained at optimum level for patient  Outcome: Progressing  Goal: Patient will maintain normal GI status  Outcome: Progressing  Goal: Address patient self-management plan  Outcome: Progressing  Goal: Patient/Patient Care Companion demonstrates understanding of disease process, treatment plan, medications, and discharge plan  Outcome: Progressing   Oob ambulated with PT, voiding clear yellow urine, post void bladder scan . Right knee dressing changed this AM by Alycia Rossetti. Hold Aspirin if bloody drainage present on new dressing.

## 2020-08-20 NOTE — Progress Notes (Signed)
Pt interviewed and denies any anesthetic complications   except for nausea controlled by antiemetics.+voiding    Post-op Day 1    Date:  08/20/2020 Time:  8:49 AM      Visit Type:  Inpatient Room #   K530/K530-01    Subjective:  " I have some pain in my lower right leg"    Type of block: Adductor Canal Single Shot with Exparel + IPACK  Surgery: ARTHROPLASTY, KNEE, TOTAL, COMPUTER NAVIGATION,    Objective:  Pain Score  5- pain in lower leg                    Motor Block  No                    Sensory Block  No                    Catheter site  N/A    Assessment:  Surgical pain controlled    Plan:  No further management    Start or continue oral pain medications   Yes, Tylenol 1000 mg every 8 hours and oxycodone 5-10 mg every 3 hours prn.      Would pt receive block again?  Yes

## 2020-08-20 NOTE — Progress Notes (Addendum)
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    DME: Has FWW/cane  Home Health: na  Transportation: car  Out Patient: na     Assessment Care Manager role and services introduced to patient, her son Onalee Hua and grand daughter [phone])at the bedside. Patient lives at home independently, her grand daughter Dahlia Client will be staying with her at discharge. Nauseous today.    Discharge Barriers identified upon initial assessment: na   Recommendation   Possible post-hospitalization need based on patient'Giles GOC and treatment plan:      Home pending progress and stair management.               CM will continue to monitor patient'Giles progression of care, d/c needs and possible barriers to discharge.     Max Fickle RN BSN CMSRN   Case Manager ext [phone]       08/20/20 1302   Patient Type   Within 30 Days of Previous Admission? No   Healthcare Decisions   Interviewed: Patient; Family   Orientation/Decision Making Abilities of Patient Alert and Oriented x3, able to make decisions   Prior to admission   Prior level of function Independent with ADLs   Type of Residence Private residence   Home Layout Two level   Have running water, electricity, heat, etc? Yes   Living Arrangements Children; Family members   How do you get to your MD appointments? self   How do you get your groceries? self   Who fixes your meals? self   Who does your laundry? self   Who picks up your prescriptions? self   Dressing Independent   Grooming Independent   Feeding Independent   Bathing Independent   Toileting Independent   DME Currently at [employer], [employer]; [place], [place]   Name of Prior Assisted  Living Facility na   Prior SNF admission? (Detail) na   Prior Rehab admission? (Detail) na   Discharge Planning   Support Systems Family members   Mode of transportation: Private car (family member)   Does the patient have perscription coverage? Yes   Consults/Providers   PT Evaluation Needed 1   OT Evalulation Needed 1   SLP Evaluation Needed 2   Outcome Palliative Care Screen Screened but did not meet criteria for intervention   Correct PCP listed in Epic? Yes

## 2020-08-20 NOTE — OT Progress Note (Signed)
Occupational Therapy Note    Attempted to see pt this am for Occupational Therapy evaluation. Pt declining at this time due to nausea and not feeling well.  Reviewed DME and home safety recommendations with pt and family at bedside.  Pt will have assistance from family upon d/c.  Will continue to follow and attempt later as able.  Nursing aware, confirmed with nursing, pt not discharging today.    Daria Pastures, OT

## 2020-08-21 NOTE — OT Eval Note (Signed)
[facility]   Occupational Therapy Evaluation     Patient: [name]      MRN#: [id number]   Unit: 5NEW ORTHOPEDICS  Bed: K530/K530-01      Attention Physicians:  For patients in an observation or outpatient status, Medicare requires the ordering provider to certify that this service is medically necessary.  Please co-sign this evaluation to indicate your agreement.  Thank you.    Time Calculation  OT Received On: 08/21/20  Start Time: 0830  Stop Time: 0903  Time Calculation (min): 33 min    Consult received for Tylene Fantasia Boileau for OT Evaluation and Treatment.  Patients medical condition is appropriate for Occupational therapy intervention at this time.      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 female admitted on 08/19/2020 with TKR (R) on 11/08    Patient Active Problem List   Diagnosis    Primary osteoarthritis of right knee    Post-operative nausea and vomiting    Hypertension    Asthma    Gastroesophageal reflux disease    Anxiety    Osteoarthritis of right knee, unspecified osteoarthritis type    Primary osteoarthritis of one knee, right      Past Medical History:   Diagnosis Date    Anxiety     situational, PCP provided xanax x 3 nights    Arthritis     right knee, hips, lower back     Asthma      well controlled    Breast CA 06/20/2012    Breast cancer 2012    left lumpectomy with radiation tx    Gastroesophageal reflux disease     mild intermittent    Has immunity to COVID-19 virus 07/30/2020    Pfizer x3    Hx of breast cancer 05/01/2014    Left 1 cm grade 1, ER positive, SLN negative     Hypertension     controlled with med.    Low back pain     Post-operative nausea and vomiting     Primary osteoarthritis of one knee, left 12/21/2016    Primary osteoarthritis of right knee     c/o right knee pain (dull ache), causing restrictive moment & affecting activities. Tried PT & injections with temporary relief. Indication for surgery    Seasonal allergies      Past Surgical History:   Procedure Laterality Date    ABCESS DRAINAGE      from buttock    ARTHROPLASTY, KNEE, TOTAL, COMPUTER NAVIGATION Left 12/21/2016    Procedure: ARTHROPLASTY, KNEE, TOTAL, COMPUTER NAVIGATION;  Surgeon: Cathey Endow, MD;  Location: [facility];  Service: Orthopedics;  Laterality: Left;    ARTHROPLASTY, KNEE, TOTAL, COMPUTER NAVIGATION Right 08/19/2020    Procedure: ARTHROPLASTY, KNEE, TOTAL, COMPUTER NAVIGATION;  Surgeon: Anise Salvo,  Quita Skye, MD;  Location: [facility];  Service: Orthopedics;  Laterality: Right;    BREAST SURGERY Left 01/2011    well diff CA 0.8 cm Grade 1 (T1) N0MX  ER +    D&C WITH HYSTEROSCOPY      EGD, COLONOSCOPY N/A 01/19/2018    Procedure: EGD, COLONOSCOPY;  Surgeon: Caleb Popp, MD;  Location: [id number];  Service: Gastroenterology;  Laterality: N/A;  EGD, COLONOSCOPY W/IVA    EYE SURGERY Left     cataract    HYSTERECTOMY  1992    JOINT REPLACEMENT           Prior Level of Function/Social History:  Prior Level of Function  Prior level of function: Independent with ADLs  Baseline Activity Level: Community ambulation  Driving: independent  Cooking: Yes; independent  Employment: Retired  DME Currently at [employer]: Environmental [employer], [employer]; [place], [place] [employer] Living  Arrangements  Living Arrangements: Alone (Grandaughter will be coming to stay with patient to help )  Type of Home: House  Home Layout: Multi-level  Bathroom Shower/Tub: Walk-in Music therapist: Academic librarian Accessibility: Accessible  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 Nursing clears patient for therapy.      Pain Assessment  Pain Assessment: No/denies pain       Objective:       Patient is in bed with SCD's and peripheral IV in place.    Vitals: HR: 65, BP:116/45, O2: 96 on RA.     Current Level of Function:  Cognitive Status and Neuro Exam:  Cognition/Neuro Status  Arousal/Alertness: Appropriate responses to stimuli  Attention Span: Appears intact  Orientation Level: Oriented X4  Memory: Appears intact  Following Commands: independent  Safety Awareness: independent  Insights: Fully aware of deficits  Problem Solving: Able to problem solve independently  Behavior: calm; cooperative  Motor Planning: intact  Coordination: GMC impaired        Vision - Complex Assessment  Ocular Range of Motion: Within Functional Limits  Head Position: Within Defined Limits           Musculoskeletal Examination  Gross ROM  Right Upper Extremity ROM: within functional limits  Left Upper Extremity ROM: within functional limits  Gross Strength  Right Upper Extremity Strength: within functional limits  Left Upper Extremity Strength: within functional limits             Sensory/Oculomotor Examination  Sensory  Auditory: intact  Vision - Complex Assessment  Ocular Range of Motion: Within Functional Limits  Head Position: Within Defined Limits    Activities of Daily Living  Self-care and Home Management  Eating: Independent; in bed  Grooming: Contact Guard Assist; standing at sink; wash/dry hands  LB Dressing: Minimal Assist; sitting (don/doff knee immobilizer )  Toileting: Armed forces logistics/support/administrative officer Transfers:  Nurse, mental health Mobility:  Mobility and Transfers  Sit to Stand: Armed forces logistics/support/administrative officer Mobility/Ambulation: Risk analyst (w/ RW)  PMP - Progressive Mobility Protocol   PMP Activity: Step 6 - Walks in Room  Distance Walked (ft) (Step 6,7): 100 Feet     Balance  Balance  Static Sitting Balance: Independent  Dyanamic Sitting Balance: Supervision  Static Standing Balance: Contact Guard Assist  Dynamic Standing Balance: Contact Guard Assist    Participation and Activity Tolerance  Participation Effort: excellent  Endurance: Tolerates < 10 min exercise, no significant change in vital signs        Assessment:   Patient History - Chart reviewed, no additional research required.    Examination - (Low = 1-3 performance deficits related to plan of care; Mod = 3-5; High = 5 or more)  Examination reveals impairments in the following body systems: Assessment: decreased strength; decreased ROM; decreased independence with ADLs; decreased independence with IADLs; decreased endurance/activity tolerance.      Clinical Decision Making - No co-morbidities affecting plan of care.       Prognosis: Good    Plan:   Treatment Interventions: ADL retraining; Functional transfer training; Endurance training; Patient/Family training; Compensatory technique education       OT Frequency Recommended: 3-4x/wk       Goals:      Goals  Goal Formulation: Patient  Time For Goal Achievement: 5 visits  Patient will dress lower body: Contact Guard Assist; 5 visits      Education:   Educated patient to role of occupational therapy, plan of care, home safety. Verbalized good understanding. Pt was able to demonstrate don/doff of brace straps for knee. Also discussed goals of therapy and are in agreement with plan.        Interdisciplinary Communication   Patient is in chair alarm set, and call bell within reach. Communicated with RN via FTF regarding pt status.            Signature: Gaspar Skeeters OTS    I was present for  and directed the plan of care for the patient.     Janyth Pupa, OT

## 2020-08-21 NOTE — Progress Notes (Signed)
No apparent complications following general anesthesia with PNB.  Nausea after Oxycodone so just taking Tylenol.  Doing well. Eating solids this morning without nausea.    Post-op Day 2    Date:  08/21/2020 Time:  9:31 AM      Visit Type:  Inpatient Room #   K530/K530-01    Subjective:  No complaints    Type of block: Single Shot Adductor Canal with Exparel + IPACK  Surgery: TKA, computr navigation    Objective:  Pain Score  4                    Motor Block  No                    Sensory Block  No                    Catheter site  N/A    Assessment:  Surgical pain controlled    Plan:  No further management    Start or continue oral pain medications   Yes.  Tylenol only     Would pt receive block again?  Yes

## 2020-08-21 NOTE — Progress Notes (Signed)
PROGRESS NOTE    Date Time: 08/21/20 6:22 AM  Patient Name: SELISA, TENSLEY S      Assessment:   2 Days Post-Op status post right total knee arthroplasty    Plan:   Physical therapy  DVT prophylaxis: ambulation, foot pumps, ASA  D/C planning: Anticipate discharge home tomorrow    Subjective:   Feeling a little better today.  Nausea improved.  Doing well with therapy.    Physical Exam:     Vitals:    08/21/20 0509   BP: 102/56   Pulse: 76   Resp: 15   Temp: 97.7 F (36.5 C)   SpO2: 96%       Intake and Output Summary (Last 24 hours) at Date Time    Intake/Output Summary (Last 24 hours) at 08/21/2020 0622  Last data filed at 08/21/2020 0509  Gross per 24 hour   Intake 3445 ml   Output 2121 ml   Net 1324 ml       Right knee    -Dressing in place.  No erythema or drainage.  - Calf soft and non-tender.  - Palpable dorsalis pedis and posterior tibial pulse  - Sensation to light touch intact throughout the right foot  - Motor: Intact toe flexion/extension and ankle dorsiflexion/plantarflexion      Labs:     Results     Procedure Component Value Units Date/Time    GFR [[id number]] Collected: 08/20/20 0744     Updated: 08/20/20 0835     EGFR >60.0    Basic Metabolic Panel [[id number]]  (Abnormal) Collected: 08/20/20 0744    Specimen: Blood Updated: 08/20/20 0835     Glucose 122 mg/dL      BUN 13 mg/dL      Creatinine 0.6 mg/dL      Calcium 7.8 mg/dL      Sodium 413 mEq/L      Potassium 4.0 mEq/L      Chloride 106 mEq/L      CO2 23 mEq/L      Anion Gap 5.0    Hemoglobin and hematocrit, blood [[id number]] Collected: 08/20/20 0744    Specimen: Blood Updated: 08/20/20 0753     Hgb 11.6 g/dL      Hematocrit 53.6 %               Signed by: Cathey Endow, MD

## 2020-08-21 NOTE — PT Progress Note (Signed)
Physical Therapy Note    [facility]  Physical Therapy Treatment    [address]  [place] [address]  [phone]    Patient:  [name] MRN#:  [id number]  Unit:  5NEW ORTHOPEDICS Room/Bed:  K530/K530-01    Time of treatment: Start Time: 0710 Stop Time: 0745  Time Calculation (min): 35 min  PT Received On: 08/21/20    Treatment # 3    Precautions  Weight Bearing Status: no restrictions  Total Knee Replacement: knee immobilizer; OOB  Other Precautions: Falls    Patient's medical condition is appropriate for Physical Therapy intervention at this time.     PPE worn: procedural mask, goggles  and gloves     Subjective:   Patient is agreeable to participation in the therapy session.  Pain: 5/10, R Knee, cold applied    Objective:  Patient is ssupine in bed with no medical equipment in place.    Exercise: ankle pumps, quad sets, gluteal sets, heel slides, short-arc quads, straight leg raises, and hamstring stretch.    ROM:0-40 degrees    Bed Mobility:  Min A x 1 w/ verbal and tactile cues fortechnique, sequencing, hand placement,and rail use.  Patient continues to require assist to move operative LE in and out of bed due to pain.    Sit to stand: Sup x 1 w/ verbal cues fortechnique, sequencing, hand placement, walker use,and rail use.    Ambulation: 100 feet using rolling walker with sup x 1 and verbal cues for technique, sequencing, walker use, posture, step length, speed, safety, and balance.    Stairs: not tested.    Education:  Educated the patient in precautions, home safety, home exercise, use of ice, car and bathroom transfers. Demonstrated good understanding of all.    Assessment: Patient instructed in total joint exercise program.  Required verbal and tactile cues for technique.        Goals  Pt Will Perform Sit to Stand: independent; Not met  Pt Will Ambulate: 101-150 feet; with rolling walker; independent; Not met  Pt Will Go Up / Down Stairs: 3-5 stairs; with minimal  assist; With rail; [facility]; Not met  Pt Will Perform Home Exer Program: with minimal assist; Goal met      Progress Toward Goals: slow    Barriers to progress:pain, nausea.          Recommendation  Discharge Recommendation: Home with outpatient PT  PT - Next Visit Recommendation: 08/22/20  PT Frequency: 7x/wk    Recommended mode of transportation at discharge:   Car    PMP - Progressive Mobility Protocol   PMP Activity: Step 7 - Walks out of Room  Distance Walked (ft) (Step 6,7): 100 Feet     RN notified of session outcome.     Plan:   Continue plan of care.    Signature: Jacqulyn Cane, PT, DPT

## 2020-08-21 NOTE — Plan of Care (Signed)
NURSE NOTE SUMMARY  [facility] - 5NEW ORTHOPEDICS   Patient Name: [name]   Attending Physician: Cathey Endow, MD   Today's date:   08/21/2020 LOS: 1 days   Shift Summary:                                                              Pt A&Ox4. Pt's VSS; pt de-satting to 86 while sleeping; applied 2L NC for periods of rest. Pt OOB with immobilizer and walker frequently to use restroom; bladder scanned after pt only voided 136mL--pt retaining . Pt is voiding adequately. Pt reporting nausea overnight; PRN reglan and zofran given. Pt reporting moderate pain treated with PRN 5mg  roxicodone. Pt's NV checks WNL; swelling noted in surgical leg. Dressing is CDI. Pt planned to be discharged today. Will continue with plan of care.      Provider Notifications:   N/a     Rapid Response Notifications:  Mobility:   N/a   PMP Activity: Step 6 - Walks in Room (08/20/2020  9:47 PM)     Weight tracking:  Family Dynamic:   Last 3 Weights for the past 72 hrs (Last 3 readings):   Weight   08/20/20 2129 57.6 kg (127 lb)   08/19/20 0859 57.7 kg (127 lb 3.2 oz)             Last Bowel Movement   No data recorded       Problem: Safety  Goal: Patient will be free from injury during hospitalization  Outcome: Progressing  Flowsheets (Taken 08/21/2020 6301)  Patient will be free from injury during hospitalization:   Assess patient's risk for falls and implement fall prevention plan of care per policy   Provide and maintain safe environment   Use appropriate transfer methods   Ensure appropriate safety devices are available at the bedside   Include patient/ family/ care giver in decisions related to safety  Goal: Patient will be free from infection during hospitalization  Outcome: Progressing  Flowsheets (Taken 08/21/2020 0655)  Free from Infection during hospitalization:   Assess and monitor for signs and symptoms of infection   Monitor lab/diagnostic results   Monitor all insertion sites (i.e. indwelling lines, tubes,  urinary catheters, and drains)   Encourage patient and family to use good hand hygiene technique     Problem: Pain  Goal: Pain at adequate level as identified by patient  Outcome: Progressing  Flowsheets (Taken 08/21/2020 0655)  Pain at adequate level as identified by patient:   Identify patient comfort function goal   Assess for risk of opioid induced respiratory depression, including snoring/sleep apnea. Alert healthcare team of risk factors identified.   Assess pain on admission, during daily assessment and/or before any "as needed" intervention(s)   Reassess pain within 30-60 minutes of any procedure/intervention, per Pain Assessment, Intervention, Reassessment (AIR) Cycle   Evaluate if patient comfort function goal is met   Evaluate patient's satisfaction with pain management progress   Offer non-pharmacological pain management interventions   Include patient/patient care companion in decisions related to pain management as needed     Problem: Side Effects from Pain Analgesia  Goal: Patient will experience minimal side effects of analgesic therapy  Outcome: Progressing  Flowsheets (Taken 08/21/2020 6010)  Patient will experience minimal  side effects of analgesic therapy:   Monitor/assess patient's respiratory status (RR depth, effort, breath sounds)   Assess for changes in cognitive function   Prevent/manage side effects per LIP orders (i.e. nausea, vomiting, pruritus, constipation, urinary retention, etc.)     Problem: Discharge Barriers  Goal: Patient will be discharged home or other facility with appropriate resources  Outcome: Progressing  Flowsheets (Taken 08/21/2020 0655)  Discharge to home or other facility with appropriate resources: Provide appropriate patient education     Problem: Psychosocial and Spiritual Needs  Goal: Demonstrates ability to cope with hospitalization/illness  Outcome: Progressing  Flowsheets (Taken 08/21/2020 0655)  Demonstrates ability to cope with hospitalizations/illness:    Encourage verbalization of feelings/concerns/expectations   Provide quiet environment   Include patient/ patient care companion in decisions     Problem: Moderate/High Fall Risk Score >5  Goal: Patient will remain free of falls  Outcome: Progressing  Flowsheets (Taken 08/21/2020 0655)  VH High Risk (Greater than 13):   ALL REQUIRED LOW INTERVENTIONS   ALL REQUIRED MODERATE INTERVENTIONS   RED "HIGH FALL RISK" SIGNAGE   BED ALARM WILL BE ACTIVATED WHEN THE PATEINT IS IN BED WITH SIGNAGE "RESET BED ALARM"   A CHAIR PAD ALARM WILL BE USED WHEN PATIENT IS UP SITTING IN A CHAIR   PATIENT IS TO BE SUPERVISED FOR ALL TOILETING ACTIVITIES   Use assistive devices     Problem: Knee Surgery  Goal: Hemodynamic Stability  Outcome: Progressing  Flowsheets (Taken 08/21/2020 0655)  Hemodynamic stability:   Monitor/assess vital signs   Maintain temperature within desired parameters   Monitor SpO2 and treat as needed   Monitor intake and output.  Notify LIP if urine output is less than 30 ml/hr.   Monitor/assess lab values and report abnormal values  Goal: Pain at adequate level as identified by patient  Outcome: Progressing  Flowsheets (Taken 08/21/2020 0658)  Pain at adequate level as identified by patient:   Identify patient comfort function goal   Administer analgesics as prescribed to achieve pain goal   Evaluate if patient comfort function goal is met  Goal: Stable Neurovascular Status  Outcome: Progressing  Flowsheets (Taken 08/21/2020 0658)  Stable neurovascular status:   Assess and document plantar/dorsiflexion every 4 hours   Monitor/assess neurovascular status (pulses, capillary refill, pain, paresthesia, presence of edema)   Monitor/assess for return of sensation after nerve block therapy if indicated   VTE prevention: administer anticoagulant(s) and/or apply anti-embolism stockings/devices as ordered  Goal: Free from Infection  Outcome: Progressing  Flowsheets (Taken 08/21/2020 0658)  Free from infection:    Monitor/assess vital signs   Maintain temperature within desired parameters   Assess for signs and symptoms of infection   Assess surgical dressing, reinforce or change as needed per order   Teach/reinforce use of incentive spirometer 10 times per hour while awake, cough and deep breath as needed   Administer and discontinue antibiotics per SCIP measures  Goal: Mobility/activity is maintained at optimum level for patient  Outcome: Progressing  Flowsheets (Taken 08/21/2020 0658)  Mobility/activity is maintained at optimum level for patient:   Administer analgesics as prescribed to achieve pain goal   Review weight bearing status with patient/patient care companion   Teach/review/reinforce exercises (ankle pumps, quad sets, gluteal sets)   Teach/review/reinforce knee precautions with patient/patient care companion (no pillow under knee, lock out knee flexion feature on bed)   Ambulate equal to or greater than 50 feet   Dangle/stand at bedside if indicated with assistance as  needed   Utilize special equipment (trapeze, abduction pillow, regular pillow, walker) as needed and as ordered  Goal: Patient will maintain normal GI status  Outcome: Progressing  Flowsheets (Taken 08/21/2020 1610)  Patient will maintain normal GI status:   Assess for nausea and/or vomiting. Provide pharmacological and/or non-pharmacological support as needed.   Assess for normal bowel sounds   Assess and document if patient is passing gas, each shift.   Administer stool softener as prescribed  Goal: Patient/Patient Care Companion demonstrates understanding of disease process, treatment plan, medications, and discharge plan  Outcome: Progressing  Flowsheets (Taken 08/21/2020 [phone])  Patient/patient care companion demonstrates understanding of disease process, treatment plan, medications, and discharge plan: Provide education on patient medications, medication side effects, dressing changes, activity level, brace instructions and care to  patient/patient care companion

## 2020-08-21 NOTE — Plan of Care (Signed)
Pt resting in bed, NV checks WNL, VSS. Dsg to RLE C/D/I. Voiding without issues and walking in halls with walker and SBA- no problems. Plan to [place] home tomorrow. Tolerating reg diet, Nausea has improved since no admin of oxycodone.  No needs at this time, no c/o severe pain, SOB, CP, N/V. Report given to [facility].        Problem: Safety  Goal: Patient will be free from injury during hospitalization  Outcome: Progressing  Goal: Patient will be free from infection during hospitalization  Outcome: Progressing     Problem: Pain  Goal: Pain at adequate level as identified by patient  Outcome: Progressing     Problem: Side Effects from Pain Analgesia  Goal: Patient will experience minimal side effects of analgesic therapy  Outcome: Progressing     Problem: Discharge Barriers  Goal: Patient will be discharged home or other facility with appropriate resources  Outcome: Progressing     Problem: Psychosocial and Spiritual Needs  Goal: Demonstrates ability to cope with hospitalization/illness  Outcome: Progressing     Problem: Moderate/High Fall Risk Score >5  Goal: Patient will remain free of falls  Outcome: Progressing     Problem: Knee Surgery  Goal: Hemodynamic Stability  Outcome: Progressing  Goal: Pain at adequate level as identified by patient  Outcome: Progressing  Goal: Stable Neurovascular Status  Outcome: Progressing  Goal: Free from Infection  Outcome: Progressing  Goal: Mobility/activity is maintained at optimum level for patient  Outcome: Progressing  Goal: Patient will maintain normal GI status  Outcome: Progressing  Goal: Address patient self-management plan  Outcome: Progressing  Goal: Patient/Patient Care Companion demonstrates understanding of disease process, treatment plan, medications, and discharge plan  Outcome: Progressing

## 2020-08-22 MED ORDER — TRAMADOL HCL 50 MG PO TABS
ORAL_TABLET | ORAL | 0 refills | Status: AC
Start: 2020-08-22 — End: ?

## 2020-08-22 MED ORDER — CEFUROXIME AXETIL 500 MG PO TABS
500.0000 mg | ORAL_TABLET | Freq: Two times a day (BID) | ORAL | 0 refills | Status: AC
Start: 2020-08-22 — End: 2020-08-29

## 2020-08-22 MED ORDER — ONDANSETRON HCL 4 MG PO TABS
4.0000 mg | ORAL_TABLET | Freq: Three times a day (TID) | ORAL | 0 refills | Status: AC | PRN
Start: 2020-08-22 — End: ?

## 2020-08-22 MED ORDER — ASPIRIN 81 MG PO TBEC
81.0000 mg | DELAYED_RELEASE_TABLET | Freq: Two times a day (BID) | ORAL | 0 refills | Status: AC
Start: 2020-08-22 — End: ?

## 2020-08-22 NOTE — Progress Notes (Signed)
Dressing to R knee ace wrap C/D/I.knee immobilizer in place.   ++ pulses. Good cap refill. Call bell within reach. Bed in low position. Will continue to monitor and hourly round.

## 2020-08-22 NOTE — Plan of Care (Signed)
Problem: Safety  Goal: Patient will be free from injury during hospitalization  Outcome: Progressing  Flowsheets (Taken 08/22/2020 1032)  Patient will be free from injury during hospitalization:   Assess patient's risk for falls and implement fall prevention plan of care per policy   Provide and maintain safe environment   Hourly rounding  Goal: Patient will be free from infection during hospitalization  Outcome: Progressing   Bed alarm and charm active. Fall precautions in place.     Problem: Pain  Goal: Pain at adequate level as identified by patient  Outcome: Progressing  Flowsheets (Taken 08/22/2020 1032)  Pain at adequate level as identified by patient:   Identify patient comfort function goal   Assess pain on admission, during daily assessment and/or before any "as needed" intervention(s)   Evaluate if patient comfort function goal is met     Problem: Side Effects from Pain Analgesia  Goal: Patient will experience minimal side effects of analgesic therapy  Outcome: Progressing  Flowsheets (Taken 08/22/2020 1032)  Patient will experience minimal side effects of analgesic therapy: Monitor/assess patient's respiratory status (RR depth, effort, breath sounds)     Pain well controlled with tylenol     Problem: Discharge Barriers  Goal: Patient will be discharged home or other facility with appropriate resources  Outcome: Progressing  Flowsheets (Taken 08/21/2020 0655 by Damaris Hippo, RN)  Discharge to home or other facility with appropriate resources: Provide appropriate patient education     Problem: Psychosocial and Spiritual Needs  Goal: Demonstrates ability to cope with hospitalization/illness  Outcome: Progressing     Problem: Moderate/High Fall Risk Score >5  Goal: Patient will remain free of falls  Outcome: Progressing     Problem: Knee Surgery  Goal: Hemodynamic Stability  Outcome: Progressing  Flowsheets (Taken 08/22/2020 0107 by Marga Hoots, RN)  Hemodynamic stability:   Monitor/assess vital  signs   Monitor intake and output.  Notify LIP if urine output is less than 30 ml/hr.  Goal: Pain at adequate level as identified by patient  Outcome: Progressing  Flowsheets (Taken 08/22/2020 0107 by Marga Hoots, RN)  Pain at adequate level as identified by patient:   Evaluate if patient comfort function goal is met   Administer analgesics as prescribed to achieve pain goal  Goal: Stable Neurovascular Status  Outcome: Progressing  Flowsheets (Taken 08/22/2020 1032)  Stable neurovascular status: Monitor/assess neurovascular status (pulses, capillary refill, pain, paresthesia, presence of edema)    Neurovascular assessment intact.    Goal: Free from Infection  Outcome: Progressing  Flowsheets (Taken 08/22/2020 0107 by Marga Hoots, RN)  Free from infection:   Monitor/assess vital signs   Assess for signs and symptoms of infection   Teach/reinforce use of incentive spirometer 10 times per hour while awake, cough and deep breath as needed  Goal: Mobility/activity is maintained at optimum level for patient  Outcome: Progressing  Flowsheets (Taken 08/22/2020 1032)  Mobility/activity is maintained at optimum level for patient: Utilize special equipment (trapeze, abduction pillow, regular pillow, walker) as needed and as ordered  Goal: Patient will maintain normal GI status  Outcome: Progressing  Flowsheets (Taken 08/22/2020 1032)  Patient will maintain normal GI status:   Administer stool softener as prescribed   Assess for normal bowel sounds  Goal: Address patient self-management plan  Outcome: Progressing  Flowsheets (Taken 08/20/2020 0530 by Rito Ehrlich, RN)  Address patient self-management plan:   Promote lifestyle changes that support self-management activities   Evaluate barriers of lifestyle changes  Goal: Patient/Patient Care Companion demonstrates understanding of disease process, treatment plan, medications, and discharge plan  Outcome: Progressing  Flowsheets (Taken 08/21/2020 0658 by Damaris Hippo, RN)  Patient/patient care companion demonstrates understanding of disease process, treatment plan, medications, and discharge plan: Provide education on patient medications, medication side effects, dressing changes, activity level, brace instructions and care to patient/patient care companion

## 2020-08-22 NOTE — Plan of Care (Signed)
Neurovascular: WNL  Mobility: Reports heaviness to right leg and some tingling but thinks it may be "leg waking up". Encouraged patient to discuss with PT when she might be able to ambulate without immobilizer. Walked hallway with immobilizer, walker, and standby assist.  Pain: Well managed with tylenol. Cold compress to affected area.  GI: Denies nausea. Avoiding oxycodone.    Problem: Knee Surgery  Goal: Hemodynamic Stability  Outcome: Progressing  Flowsheets (Taken 08/22/2020 0107)  Hemodynamic stability:   Monitor/assess vital signs   Monitor intake and output.  Notify LIP if urine output is less than 30 ml/hr.  Goal: Pain at adequate level as identified by patient  Outcome: Progressing  Flowsheets (Taken 08/22/2020 0107)  Pain at adequate level as identified by patient:   Evaluate if patient comfort function goal is met   Administer analgesics as prescribed to achieve pain goal  Goal: Stable Neurovascular Status  Outcome: Progressing  Flowsheets (Taken 08/22/2020 0107)  Stable neurovascular status:   Assess and document plantar/dorsiflexion every 4 hours   Monitor/assess neurovascular status (pulses, capillary refill, pain, paresthesia, presence of edema)   Monitor/assess for return of sensation after nerve block therapy if indicated   VTE prevention: administer anticoagulant(s) and/or apply anti-embolism stockings/devices as ordered  Goal: Free from Infection  Outcome: Progressing  Flowsheets (Taken 08/22/2020 0107)  Free from infection:   Monitor/assess vital signs   Assess for signs and symptoms of infection   Teach/reinforce use of incentive spirometer 10 times per hour while awake, cough and deep breath as needed  Goal: Mobility/activity is maintained at optimum level for patient  Outcome: Progressing  Flowsheets (Taken 08/22/2020 0107)  Mobility/activity is maintained at optimum level for patient:   Review weight bearing status with patient/patient care companion   Teach/review/reinforce exercises (ankle  pumps, quad sets, gluteal sets)   Dangle/stand at bedside if indicated with assistance as needed   Out of bed to chair if indicated with assistance as needed   Ambulate equal to or greater than 50 feet

## 2020-08-22 NOTE — PT Progress Note (Signed)
Physical Therapy Note    [facility]  Physical Therapy Treatment    [address]  [place] [address]  [phone]    Patient:  [name] MRN#:  [id number]  Unit:  5NEW ORTHOPEDICS Room/Bed:  K530/K530-01    Time of treatment: Start Time: 0915 Stop Time: 0940  Time Calculation (min): 25 min  PT Received On: 08/22/20    Treatment # 4    Precautions  Weight Bearing Status: no restrictions  Total Knee Replacement: knee immobilizer; OOB  Other Precautions: Falls    Patient's medical condition is appropriate for Physical Therapy intervention at this time.     PPE worn: procedural mask and goggles     Subjective:   Patient is agreeable to participation in the therapy session.  Pain: 3/10, R Knee, cold applied    Objective:  Patient is seated in a recliner chair with no medical equipment in place.    Exercise: ankle pumps, quad sets, gluteal sets, seated heel slides, short-arc quads, straight leg raises, and hamstring stretch.    ROM:0-45 degrees    Sit to stand:Sup x 1w/ verbal cues fortechnique, sequencing, hand placement, walker use,and rail use.    Ambulation:178feet using rolling walker withsup x 1 andverbal cues for technique, sequencing, walker use, posture, step length, speed, safety, and balance.    Stairs: 4 w/ R rail, CGA x 1, and verbal and tactile cues for technique, sequencing, rail use, speed, safety, and balance.    Education:  Educated the patient and coach in precautions, home safety, home exercise, use of ice, car and bathroom transfers. Demonstrated good understanding of all.    Assessment: Patient and coach instructed in total joint exercise program.  Required verbal and tactile cues for technique.        Goals  Pt Will Perform Sit to Stand: independent; Not met  Pt Will Ambulate: 101-150 feet; with rolling walker; independent; Not met  Pt Will Go Up / Down Stairs: 3-5 stairs; with minimal assist; With rail; With SPC; Goal met  Pt Will Perform Home Exer Program:  with minimal assist; Goal met      Progress Toward Goals: good    Barriers to progress:pain.          Recommendation  Discharge Recommendation: Home with outpatient PT  PT - Next Visit Recommendation: 08/23/20  PT Frequency: 7x/wk    Recommended mode of transportation at discharge:   Car    PMP - Progressive Mobility Protocol   PMP Activity: Step 7 - Walks out of Room  Distance Walked (ft) (Step 6,7): 150 Feet     RN notified of session outcome.     Plan:   Continue plan of care.    Signature: Jacqulyn Cane, PT, DPT

## 2020-08-22 NOTE — Discharge Summary -  Nursing (Signed)
Discussed discharge instructions with pt. Family present at bedside for discharge teaching, verbalized understanding. Pt knows when and how to take all medications at home as prescribed. Prescriptions were sent to pt home pharmacy to be picked up after discharge. Pt will call and schedule an appointment with Dr. Anise Salvo in 2 weeks for follow-up. All questions were answered and pt knows where to call with any further concerns. IV removed, catheter intact. Pt left floor via wheelchair to lobby with no incident.

## 2020-08-22 NOTE — Progress Notes (Signed)
Post-op Day 3    Date:  08/20/2020         Time:  8:49 AM      Visit Type:  Inpatient Room #   K530/K530-01    Subjective:  " I am not able to lift my leg"- plans to discuss with Alycia Rossetti or Dr. Anise Salvo    Type of block: Adductor Canal Single Shot with Exparel + IPACK  Surgery: ARTHROPLASTY, KNEE, TOTAL, COMPUTER NAVIGATION,    Objective:  Pain Score  2/10                    Motor Block  No                    Sensory Block  No                    Catheter site  N/A    Assessment:  Surgical pain controlled    Plan:  No further management    Start or continue oral pain medications   Yes, Tylenol 1000 mg every 8 hours and oxycodone 5-10 mg every 3 hours prn.    Would pt receive block again?  Yes

## 2020-08-22 NOTE — OT Progress Note (Signed)
Occupational Therapy Note    [facility]  Occupational Therapy Treatment     Patient: [name]     MRN#: [id number]   Unit: 5NEW ORTHOPEDICS  Bed: K530/K530-01    Time Calculation  OT Received On: 08/22/20  Start Time: 0827  Stop Time: 0900  Time Calculation (min): 33 min      Assessment:   Pt is able to complete LB ADLs with increased ability to instruct caregivers on how to provide Min A to don/doff knee immobilizer, as well as thread R LE with dressing.   Assessment: decreased ROM; decreased strength; balance deficits; decreased independence with ADLs; decreased independence with IADLs; decreased endurance/activity tolerance, resulting in continued difficulty with independence during ADLs.  Prognosis: Good    Plan:   Goal Formulation: Patient  OT Plan  Risks/Benefits/POC Discussed with Pt/Family: (P) With patient  Treatment Interventions: (P) ADL retraining; Functional transfer training; Endurance training; Patient/Family training; Compensatory technique education  Discharge Recommendation: (P) Home with supervision (intermittent assist from grandaughter who will be staying with her for at least a month)  DME Recommended for Discharge: (P) No additional equipment/DME recommended at this time  OT Frequency Recommended: (P) 3-4x/wk    Time For Goal Achievement: 5 visits  ADL Goals  Patient will dress lower body: Contact Guard Assist; 5 visits        Continue plan of care.       DME Recommended for Discharge: No additional equipment/DME recommended at this time    Education:   Further education provided to patient on LB dressing with Min A to be given from granddaughter.  Demonstrated good understanding.     Interdisciplinary Communication:   Patient in chair, and call bell within reach. Communicated with RN, via FTF regarding pt status.    Treatment:    Treatment # 2    Precautions and Contraindications:    Precautions  Weight Bearing Status: no restrictions  Total Knee Replacement: knee  immobilizer; OOB  PPE worn: procedural mask, goggles  and gloves     Subjective:  .       Patient's medical condition is appropriate for Occupational Therapy intervention at this time.  Patient is agreeable to participation in the therapy session. Nursing clears patient for therapy.  Pain Assessment  Pain Assessment: No/denies pain    Objective:  Patient is seated in a bedside chair with SCD's and peripheral IV in place.    Cognition/Neuro Status  Arousal/Alertness: Appropriate responses to stimuli  Attention Span: Appears intact  Orientation Level: Oriented X4  Memory: Appears intact  Following Commands: Follows all commands and directions without difficulty  Safety Awareness: independent  Insights: Fully aware of deficits  Problem Solving: Able to problem solve independently  Behavior: calm; cooperative  Motor Planning: intact  Coordination: intact    Functional Mobility  Sit to Stand Transfers: Contact Guard Assist (w/ RW)  Treatment Activities: completed to stand up and bring pants up around hips.     Self-care and Home Management  LB Dressing: Minimal Assist; Thread RLE into pants; Thread RLE into underwear (pt only required min A to complete aspects listed above)  Treatment Activities: Pt is able to instruct caregivers on how to provide min A with her don/doff of knee immobilizer brace, as well as threading R foot into underwear and pants leg.               Vision - Complex Assessment  Ocular Range of Motion: Within Functional Limits  Head Position: Within Defined Limits                                 Signature: Gaspar Skeeters OTS     I was present for and directed the plan of care for the patient.     Janyth Pupa, OT

## 2020-08-22 NOTE — Final Progress Note (DC Note for stay less than 48 (Signed)
PROGRESS NOTE    Date Time: 08/22/20 2:04 PM  Patient Name: [name], [name]  POD # : 3    Subjective:   Ambulating well with walker. Expected pain level well controlled with current medication regimen. Denies systemic symptoms currently. Voiding independently.     Objective:     Vitals:    08/22/20 1118   BP: 128/55   Pulse: 68   Resp: 18   Temp: 98.1 F (36.7 C)   SpO2: 96%       Gen: Alert and oriented, cooperative  Extremities:  Dressing C/D/I, 2+ pedal pulses, full ROM with foot pumping and toe movement, 2+ edema, No calf tenderness  Neurological:  Sensation intact to light touch    Results     ** No results found for the last 24 hours. **          Assessment:   Doing well postoperatively.    Plan:   Stable for D/C to home today if medically stable and cleared from PT standpoint.  Sterile dressing applied to be removed at 1st post op visit.  Follow up to Dr. Anise Salvo within 2 weeks.  Patient instructed to notify office for any general post op questions or concerns.    Signed by: Sol Blazing, PA

## 2020-08-23 NOTE — Progress Notes (Signed)
Transitional Care Management-Joints Initial Call    CM placed a call with patient who had been sleeping.  States she was doing "ok" and requested a call back this afternoon.  CM will contact patient at this time.     Hansel Starling, MSW  Social Work Case Manager  [facility] Management  [address]  [address]   [place], [place] [address]  [phone]

## 2020-08-23 NOTE — Progress Notes (Signed)
Transitional Care Management-Joints Initial Call    Enrollment--    Name/Number of person who participated in call:  Spoke to patient/[name]/[phone]    TCM Program explained to patient and TCM contact information provided: Yes      HIPAA verification completed and notified that call is recorded: Yes      Primary language spoken:  English    Interpreter needed: No       Health History--    Health Status (PMH, current admission summary, previous admission history):   Past Medical History:   Diagnosis Date    Anxiety     situational, PCP provided xanax x 3 nights    Arthritis     right knee, hips, lower back     Asthma     well controlled    Breast CA 06/20/2012    Breast cancer 2012    left lumpectomy with radiation tx    Gastroesophageal reflux disease     mild intermittent    Has immunity to COVID-19 virus 07/30/2020    Pf[employer] x3    Hx of breast cancer 05/01/2014    Left 1 cm grade 1, ER positive, SLN negative     Hypertension     controlled with med.    Low back pain     Post-operative nausea and vomiting     Primary osteoarthritis of one knee, left 12/21/2016    Primary osteoarthritis of right knee     c/o right knee pain (dull ache), causing restrictive moment & affecting activities. Tried PT & injections with temporary relief. Indication for surgery    Seasonal allergies      Patient Active Problem List   Diagnosis    Primary osteoarthritis of right knee    Post-operative nausea and vomiting    Hypertension    Asthma    Gastroesophageal reflux disease    Anxiety    Osteoarthritis of right knee, unspecified osteoarthritis type    Primary osteoarthritis of one knee, right     All eligible TCM Diagnoses (include EF, weight): total knee arthoplasty        Medication Reconciliation--    Medication List:   Current Outpatient Medications   Medication Sig Dispense Refill    acetaminophen (TYLENOL) 500 MG tablet Take 500 mg by mouth every 6 (six) hours as needed.          ALPRAZolam  (XANAX) 1 MG tablet Take 0.5 mg by mouth nightly as needed         aspirin EC 81 MG EC tablet Take 1 tablet (81 mg total) by mouth 2 (two) times daily 60 tablet 0    Breo Ellipta 100-25 MCG/INH Aerosol Pwdr, Breath Activated 1 puff daily      calcium carb-cholecalciferol (Caltrate 600+D3) 600-800 MG-UNIT Tab Take 1 tablet by mouth 2 (two) times daily      Calcium Carbonate-Vitamin D (CALTRATE 600+D PO) Take 600 mg by mouth 2 (two) times daily         cefuroxime (CEFTIN) 500 MG tablet Take 1 tablet (500 mg total) by mouth 2 (two) times daily for 7 days 14 tablet 0    ciclopirox (PENLAC) 8 % solution Apply topically nightly Apply over nail and surrounding skin. Apply daily over previous coat. After seven (7) days, may remove with alcohol and continue cycle.  To 2 toes on right      denosumab (Prolia) 60 MG/ML Solution Prefilled Syringe subcutaneous injection Inject 60 mg into the skin once  Every 6 months      escitalopram (LEXAPRO) 5 MG tablet Take 10 mg by mouth every evening         famotidine (PEPCID) 40 MG tablet Take 40 mg by mouth daily AC dinner      Lactobacillus (PROBIOTIC ACIDOPHILUS PO) Take by mouth daily      lansoprazole (PREVACID) 30 MG capsule Take 30 mg by mouth daily      loratadine (CLARITIN) 10 MG tablet Take 10 mg by mouth every evening          losartan (COZAAR) 100 MG tablet Take 100 mg by mouth daily      montelukast (SINGULAIR) 10 MG tablet Take 10 mg by mouth nightly.      ondansetron (Zofran) 4 MG tablet Take 1 tablet (4 mg total) by mouth every 8 (eight) hours as needed for Nausea 40 tablet 0    senna-docusate (PERICOLACE) 8.6-50 MG per tablet Take 2 tablets by mouth 2 (two) times daily as needed for Constipation. (Patient taking differently: Take 2 tablets by mouth daily   ) 100 tablet 0    traMADol (ULTRAM) 50 MG tablet Take 1-2 tablets every 6 hours PRN pain 60 tablet 0     No current facility-administered medications for this visit.     Were the medications reviewed  with the patient:  Yes    Was the patient able to obtain all of his/her medications when they left the hospital: Yes-except Ceftin (which patient reported Dr Anise Salvo stated not to take)     Does the patient understand what all of his/her medications are for: Yes    Patient aware of side effects from provided teaching in hospital or pamphlets: Yes    Was teaching done on medications: Yes     Surgeon Follow Up--    Has patient reviewed the AVS for follow up appointment guidance:  Yes    Was the patient scheduled/seen by surgeon within critical timeframe of hospital discharge as per surgeon protocol: Yes- patient scheduled surgeon appointment for 11/19    Does patient understand importance of seeing the surgeon within critical timeframe of discharge as per surgeon protocol: Yes    Name/number of surgeon: Dr. Anise Salvo [phone]      Home Health Co-Management--    Is patient being followed by Home Health: No - patient is beginning outpatient PT on 11/18 with [facility] [place] (granddaughter will take to appointment)    Joint Management--    What is the patients understanding of the wound care instructions as per the After Visit Summary (AVS):  Patient understands the importance of mobility to healing and symptoms to watch out for infection and concern with condition    Additional teaching/clarification offered as per AVS instructions: Yes    What is the patients understanding of the weight bearing status as per the AVS: Patient is aware of weight bearing as tolerated.    Was the patient educated on the joints red flags/symptoms: Yes   Fever of greater than 102.5 or higher/persistent fever and chills  Yes   Additional or an increase in drainage  Yes   Additional or new swelling in hip, knee, ankle or foot  Yes  Additional, new or uncontrolled pain in hip, knee, ankle or foot  Yes  New abdominal pain, bloating, nausea or changes in bowel  Yes  New confusion or additional drowsiness  Yes  New onset of inability to  walk or participate in therapy  Yes  Is the patient currently experiencing any of the joints red flags/symptoms: No     Was the patient instructed on what to do if he/she is experiencing any of the joints red flags/symptoms (call surgeon): Yes- patient understands to call surgeon      Was the patient educated on what to do in the event they are experiencing emergent symptoms (call 911): yes   Unrelieved shortness of breath or shortness of breath at rest   Unrelieved chest pain   Chest tightness or chest pressure at rest   Uncontrolled bleeding    How often is the patient getting up and moving around: every 2 hours to use the bathroom and also walking in the hallway    Is the patient using assistive devices per physical therapy recommendation: hasn't seen pt yet    Was the patient educated on importance of moving around every 1-2 hours (or as per surgeon protocol): yes    Was the patient discharged home with any additional equipment (rolling walker, crutches, etc.):  Yes, front wheel walker and cane    Call Summary Notes--  Patient was admitted to [facility] for a total knee arthoplasty.  CM contacted patient to check on condition- who reported moving around her home.  CM checked on medications and patient reported taking all except the anti-biotic prescribed which she contacted Dr. Anise Salvo about- who stated to not take.  Patient reported practicing pumping ankles and elevated her leg above the heart.  Patient's daughter is staying with her in the home and assisting with her care.  CM reviewed joint red flags/symptoms (fever, drainage, swelling, uncontrollable pain, nausea, confusion, and inability to walk)- which patient denied at this time.  Patient will start PT on 11/18 and see the surgeon on 11/19. CM will continue to follow.     Hansel Starling, MSW  Social Work Case Manager  [facility] Management  [address]  [address]   [place], [place] [address]  [phone]

## 2020-08-27 NOTE — Progress Notes (Addendum)
Transitional Care Management    Joints Management- Week Call 2    Teachback--    How are you feeling?  Patient reported feeling "so-so"     Are you feeling better or worse since the last time we talked?  Patient stated "improving"     Are you having any medical concerns or questions at this time?  Patient concerned about nerve block not wearing off    Can the patient identify the joints red flags/symptoms and what to do if he/she experiences them? Yes- cm reviewed joint red flags with patient     Is patient having regular bowel movements: patient reported having a bowel movement yesterday (11/15)    Is the patient back to their normal diet: Patient reported an OK appetite (stated woke up hungry today).    Is the patient participating regularly with therapy services? (home or outpatient): Patient reported starting PT on the 18th    Any issues/concerns with mobility or progress of physical therapy (home or outpatient): Patient reported improving mobility by getting in and out of the bed.       Surgeon Follow Up/Medication Follow Up--    Was the patient scheduled/seen by surgeon within critical timeframe of hospital discharge as per surgeon protocol: Patient is scheduled to see the surgeon on 11/19.      Does patient understand importance of seeing the surgeon within critical timeframe of discharge as per surgeon protocol: Yes    If patient needs to follow up with outpatient physical therapy, has patient scheduled an appointment to begin/continue with therapy: Yes patient is scheduled to see Pt on 11/18    Depression Screen--    Over the past 4 weeks, has the patient felt down, depressed or hopeless: None endorsed    Over the past 4 weeks, has the patients felt little interest or pleasure in doing things: None endorsed    Gaps in Care/Resource Connection--    Living situation (alone, family, home environment, etc):  Patient lives alone yet granddaughter is staying with her at this time.     Does the patient have  support services involved in their care: Granddaughter staying with at home    Gap in Care (insurance status, medication coverage, community connections, transportation, etc): None identified at this time    Call Summary Notes-- CM called patient to check on status.  Patient reported feeling "so-so" but "improving" today.  Patient was concerned her nerve block hadn't worn off yet- CM advised patient to contact her surgeon regarding the block.  Patient reported she is only taking Tylenol and had a good bowel movement last night.  CM reviewed joint red flags: fever, drainage, swelling, uncontrollable pain- patient denied at this time. CM completed depression screen and none found.  Cm will continue to follow.     Hansel Starling, MSW  Social Work Case Manager  [facility] Management  [address]  [address]   [place], [place] [address]  [phone]    CM called patient to check if she spoke to surgeon's office.  Patient confirmed she spoke to surgeon's office and they moved her appointment up to tomorrow 11/7 at 11 am.  CM will continue to follow.     Hansel Starling, MSW  Social Work Case Manager  [facility] Management  [address]  [address]   [place], [place] [address]  [phone]

## 2020-08-30 NOTE — Progress Notes (Signed)
Transitional Care Management-Joints    CM contacted patient to check on status- patient reported feeling "much better in the past 12 hours" and also patietn reported she is able to "move her leg much better."  Patient reported seeing her PCP on 11/17 who took an Xray showing knee is in alignment.  Patient indicated she is obtaining more feeling back in her leg.  Patient reported PT on 11/18 went well and she has been elevating her leg to the ceiling as instructed by the PCP.  CM checked on joint red flag: fever, pain, drainage, swelling- and patient denied at this time.  CM will continue to follow.     Hansel Starling, MSW  Social Work Case Manager  [facility] Management  [address]  [address]   [place], [place] [address]  [phone]

## 2020-09-02 NOTE — Discharge Summary (Signed)
Physician Discharge Summary   Patient ID:  [name]  [id number]  79 y.o.  06-07-1941    Admit date: 08/19/2020    Discharge date and time: 08/22/2020  2:56 PM     Admitting Physician: Cathey Endow, MD     Admission Diagnoses: Right knee osteoarthritis    Discharge Diagnoses: Right knee osteoarthritis    Operative Procedures: Right total knee arthroplasty    Indication for Admission: The patient has a history of right knee osteoarthritis that has become progressively worse and the patient has elected to proceed with right total knee arthroplasty.    Hospital Course: The patient was admitted to the hospital on 08/19/2020 and underwent right total knee arthroplasty. Post-operatively, the patient was transferred to the orthopaedic floor.  They received 24 hours of prophylactic intravenous antibiotics.  DVT prophylaxis included AV foot pumps, early ambulation, and aspirin was started on POD #1.      The patient progressed well throughout the hospitalization and was deemed stable for discharge on 08/22/2020 .      Disposition: Home or Self Care    Discharge Medications:    Discharge Medication List as of 08/22/2020  2:22 PM      START taking these medications    Details   cefuroxime (CEFTIN) 500 MG tablet Take 1 tablet (500 mg total) by mouth 2 (two) times daily for 7 days, Starting Thu 08/22/2020, Until Thu 08/29/2020, No Print      ondansetron (Zofran) 4 MG tablet Take 1 tablet (4 mg total) by mouth every 8 (eight) hours as needed for Nausea, Starting Thu 08/22/2020, E-Rx      traMADol (ULTRAM) 50 MG tablet Take 1-2 tablets every 6 hours PRN pain, E-Rx         CONTINUE these medications which have CHANGED    Details   aspirin EC 81 MG EC tablet Take 1 tablet (81 mg total) by mouth 2 (two) times daily, Starting Thu 08/22/2020, No Print         CONTINUE these medications which have NOT CHANGED    Details   acetaminophen (TYLENOL) 500 MG tablet Take 500 mg by mouth every 6 (six) hours as needed.    , Historical Med       ALPRAZolam (XANAX) 1 MG tablet Take 0.5 mg by mouth nightly as needed   , Historical Med      Breo Ellipta 100-25 MCG/INH Aerosol Pwdr, Breath Activated 1 puff daily, Starting Sat 08/03/2020, Historical Med      !! calcium carb-cholecalciferol (Caltrate 600+D3) 600-800 MG-UNIT Tab Take 1 tablet by mouth 2 (two) times daily, Historical Med      !! Calcium Carbonate-Vitamin D (CALTRATE 600+D PO) Take 600 mg by mouth 2 (two) times daily   , Historical Med      ciclopirox (PENLAC) 8 % solution Apply topically nightly Apply over nail and surrounding skin. Apply daily over previous coat. After seven (7) days, may remove with alcohol and continue cycle.  To 2 toes on right, Historical Med      escitalopram (LEXAPRO) 5 MG tablet Take 10 mg by mouth every evening   , Historical Med      famotidine (PEPCID) 40 MG tablet Take 40 mg by mouth daily AC dinner, Historical Med      Lactobacillus (PROBIOTIC ACIDOPHILUS PO) Take by mouth daily, Historical Med      lansoprazole (PREVACID) 30 MG capsule Take 30 mg by mouth daily, Historical Med  loratadine (CLARITIN) 10 MG tablet Take 10 mg by mouth every evening    , Historical Med      losartan (COZAAR) 100 MG tablet Take 100 mg by mouth daily, Historical Med      montelukast (SINGULAIR) 10 MG tablet Take 10 mg by mouth nightly., Historical Med      senna-docusate (PERICOLACE) 8.6-50 MG per tablet Take 2 tablets by mouth 2 (two) times daily as needed for Constipation., Starting Thu 12/24/2016, No Print      denosumab (Prolia) 60 MG/ML Solution Prefilled Syringe subcutaneous injection Inject 60 mg into the skin once Every 6 months, Historical Med       !! - Potential duplicate medications found. Please discuss with provider.      STOP taking these medications       ibuprofen (ADVIL,MOTRIN) 200 MG tablet        oxyCODONE (ROXICODONE) 5 MG immediate release tablet              Patient Instructions:     The patient will notify me for any increased bleeding, redness, drainage,  swelling, worsening pain, calf pain or swelling, or other concerning symptoms.  They have been instructed to report to the emergency room immediately for any chest pain or shortness of breath.   Physical therapy has been ordered.  Activity:The patient is weight bearing as tolerated on the operative extremity.  Wound Care: The patient has a waterproof dressing in place.  They can shower with the dressing in place.  They will remove the dressing in 1 week. At that time, they can get the incision wet in the shower, and will pat dry afterwards.  Follow-up with Dr. Anise Salvo at scheduled appointment 2 weeks post-op.    Signed:  Cathey Endow, MD  09/02/2020  3:58 PM

## 2020-09-03 NOTE — Progress Notes (Signed)
Transitional Care Management    Joints Management- Week 3 call    Teachback    How are you feeling?  Patient reported she is "improving"     Are you having any medical concerns or questions at this time?  Patient reported difficulty sleeping and is checking with PCP about taking Melotonin    Can the patient identify the joints red flags/symptoms and what to do if he/she experiences them? Yes- patient knows to contact the surgeon or PCP    Is the patient participating regularly with therapy services? (home or outpatient): Yes- patient is participating with outpatient PT     Any issues/concerns with mobility or progress of physical therapy (home or outpatient): No - patient is walking throughout the house and participating in PT      Surgeon Follow Up/Medication Follow Up    Was the patient scheduled/seen by surgeon within critical timeframe of hospital discharge as per surgeon protocol: Yes patient saw surgeon on 11/17    Does patient understand importance of seeing the surgeon within critical timeframe of discharge as per surgeon protocol: Yes    If patient seen by the surgeon, were any medications changed at follow up medical appointments: No medications changed.    Is the patient weaning off opioid pain medication:  Patient reports taking the Tramadol at night and Tylenol during the day.    Wellness    How often/much does the patient exercise: Patient is participating in PT     Has the patient been given exercise guidance from his/her doctor and/or physical therapist:  Yes      Is the patient having any issues with sleeping well: Yes    Has the patient discussed these issues with his/her doctor: CM directed to contact PCP concerning sleep issues    Joints Self-Management/Health Maintenance    Has the patients doctor recommended a special diet: No- normal diet     Advance Directives    Has the patient participated in an advance care planning process: Patient has advance directive on file.     Gaps in  Care/Resource Connection    Living situation (alone, family, home environment, etc): Patient lives at home with granddaughter staying over.    Gap in Care (insurance status, medication coverage, community connections, transportation, etc): None identified at this time.     Does the patient have support services involved in their care: Granddaughter in charge of care.    Call Summary NotesCM contacted patient to check on condition- patient reported "improving" and "some pain" today.   Patient indicated she is taking the Tramadol at night and Tylenol during daytime.  CM checked on joint red flags (nausea, uncontrollable pain, fever, incision, swelling) and patient endorsed some swelling that decreases when elevating leg.  Patient was told by PT to use an ACE wrap and planning to have PT show her how to put it on correctly.  Patient reported difficulty sleeping and thoughts of taking Melatonin Cm instructed to check with PCP.  CM will continue to follow up.     Hansel Starling, MSW  Social Work Case Manager  [facility] Management  [address]  [address]   [place], [place] [address]  [phone]

## 2020-09-10 NOTE — Progress Notes (Signed)
Transitional Care Management    Cm reached out to patient on home and cell phone with no answer.  Cm left voice message requesting a call back.  Cm will continue to follow up.     Hansel Starling, MSW  Social Work Case Manager  [facility] Management  [address]  [address]   [place], [place] [address]  [phone]

## 2020-09-11 NOTE — Progress Notes (Signed)
Transitional Care Management    Joints Management- Week 3 call- part 2    Wellness    What is the patients current/history of alcohol use/abuse: None identified    What is the patients current/history of tobacco use/abuse: None identified     Is the patient having any issues with his/her bowel movements: Patient reported she is on the constipated side    Joints Self-Management/Health Maintenance    Has the patient been instructed on the following: Yes   Health record   Hand hygiene    Call Summary NotesCm placed call to patient to check on status.  Patient reported she was doing "ok" today- "just not healing fast enough."  Cm reviewed joint red flags (fever, swelling, drainage, uncontrollable pain, nausea, confusion, inability to walk, sob chest pain, uncontrollable bleeding)- and patient denied. Patient reported she had some nausea over the weekend due to reportedly exerting herself too much.  Patient denied feeling nausea this afternoon and felt slightly nausea this morning. Cm instructed to contact the surgeon's office regarding the nausea- patient reported she sees the surgeon tomorrow morning 12/2.  Patient thought the nausea could be attributed to the Tramadol and she is now only taking once at bedtime (instead of 2x/day- bedtime/6 hours later in middle of night).  Cm checked on bowel movement- patient reported being "on the constipated side- not as regularly as should."  Patient reported she is walking around using the walker and had PT this morning.  Patient has PT again this week on Friday 12/3.  Cm will continue to follow up.     Hansel Starling, MSW  Social Work Case Manager  [facility] Management  [address]  [address]   [place], [place] [address]  [phone]

## 2020-09-13 NOTE — Progress Notes (Signed)
Transitional Care Management     Cm placed call to patient to check symptoms of nausea.  Patient reported the nausea was "better" but period occurred.  Patient reported she saw the surgeon for he post-op appointment yesterday 12/2 and they were pleased with her progress.  Cm checked on joint red flags (fever, swelling, drainage, uncontrollable pain, nausea, confusion, inability to walk, sob, chest pain, uncontrollable breathing)- patient denied all at this time. Cm checked on incision (redness, swelling, odor, warmth, pain)- and no red flags reported.  Cm checked on constipation- patient reported it was "moving" but not back to baseline.  Patient reported she stopped taking the Tramadol last night.  Cm checked on medication changes patient reported surgeon changed the Tylenol to Advil and could take Advil PM at nighttime.  Patient reported also the 81 mg Aspirin was changed to 1x/day.  Patient will reportedly begin taking the Advil PM tonight.  Patient completed PT this morning. Cm provided contact information - patient indicated no questions/concerns at this time. Cm will continue to follow up.     Hansel Starling, MSW  Social Work Case Manager  [facility] Management  [address]  [address]   [place], [place] [address]  [phone]

## 2020-09-19 NOTE — Progress Notes (Signed)
Transitional Care Management    Joints Management- Week 4 Call    Teachback and Follow Up    How are you feeling?  Pt reported feeling "good"     Are you feeling better or worse since the last time we talked?  Pt feeling "better in general"     Are you having any medical concerns or questions at this time?  "no" pt reported having ongoing GI issues- "not of concern at this time"    Can the patient identify the joints red flags/symptoms and what to do if he/she experiences them? Yes pt knows the red flags and to contact doctor if experienced    What is the patients current activity level: Pt is moving around the house and participating in outpatient PT    Is the patient experiencing any limitations in mobility or ADLs:  Pt indicates "as tolerated"     Is the patient still receiving physical therapy services: Pt is receiving outpt PT and went this AM    Has the patient weaned completely from opioid pain medications: Yes    Gaps in Care/Resource Connection    Living situation (alone, family, home environment, etc): Living at home- granddaughter staying with to help with care    Gap in Care (insurance status, medication coverage, community connections, transportation, etc): None identified at this time    Call Summary NotesCm received a return call from pt and cm called back.  Pt reported feeling good today.   Pt reported the physical therapist was pleased with progress.  Pt reported her knee "looks good and basically feels good- a little tender" today.  Pt reported she has had good sleep past 3 nights in a row- pt contacted PCP who prescribed her Xanax prescription- pt reported it's helped with sleep.  Pt is not taking Tramadol prescription and only Advil for pain.   Cm checked on joint red flags (fever, drainage, swelling, uncontrollable pain, abdominal pain, drowsiness/confusion, inability to walk, sob, chest pain, uncontrollable bleeding)- all pt denied today.  Pt reported she has been constipated but took  Miralax yesterday which helped and took again today.  Pt stated nausea has "improved but not yet 100% but really close" - cm informed to contact PCP if symptom increases.  Cm will close TCM case on day 30 as long as no readmittance to hospital.      Hansel Starling, MSW  Social Work Case Manager  [facility] Management  [address]  [address]   [place], [place] [address]  [phone]

## 2020-09-19 NOTE — Progress Notes (Signed)
Transitional Care Management    Cm placed call to home/mobile number on chart and did not reach patient.  Cm left a voice message with contact information and requested a call back.  Cm will continue to follow up.     Hansel Starling, MSW  Social Work Case Manager  [facility] Management  [address]  [address]   [place], [place] [address]  [phone]

## 2020-09-23 NOTE — Progress Notes (Signed)
Transitional Care Management    Patient has not re-admitted to hospital within 30 days- Cm will closed TCM case.     Hansel Starling, MSW  Social Work Case Manager  [facility] Management  [address]  [address]   [place], [place] [address]  [phone]

## 2020-12-11 ENCOUNTER — Other Ambulatory Visit: Payer: Self-pay | Admitting: Internal Medicine

## 2020-12-16 ENCOUNTER — Encounter (INDEPENDENT_AMBULATORY_CARE_PROVIDER_SITE_OTHER): Payer: Self-pay | Admitting: Neurological Surgery

## 2020-12-17 DIAGNOSIS — M47812 Spondylosis without myelopathy or radiculopathy, cervical region: Secondary | ICD-10-CM | POA: Insufficient documentation

## 2020-12-17 DIAGNOSIS — M5030 Other cervical disc degeneration, unspecified cervical region: Secondary | ICD-10-CM | POA: Insufficient documentation

## 2020-12-17 DIAGNOSIS — M4802 Spinal stenosis, cervical region: Secondary | ICD-10-CM | POA: Insufficient documentation

## 2020-12-17 NOTE — Progress Notes (Signed)
[facility]  New Patient Note    Date: 12/24/2020  Patient Name: [name], [name]  [id number]  Patient Care Team:  Bluford Main, MD as PCP - General (Internal Medicine)  Bluford Main, MD  Hilma Favors, MD as Consulting Physician (Internal Medicine)  Referring provider: Bluford Main, MD Bluford Main, MD      History of Present Illness:     Chief Complaint   Patient presents with    Initial Consult     numbness and tingling fingers        I was asked by Bluford Main, MD  to see [name] in consultation in order to render my professional opinion regarding the aforementioned chief complaint.    [name] is a 80 y.o. female who presents for consultation of her bilateral carpal tunnel syndrome and cervical radiculopathy.  Patient has bilateral distal digital tips numbness and tingling.  She denies radiating arm pain, elbow pain or neck pain.  She is not dropping objects.  She has difficulty opening containers and trouble turning pages.  She was diagnosed with carpal tunnel syndrome he "years ago" after her primary care noted muscle atrophy to palmar cutaneous branch, flexor pollicis brevis and abductor pollicis brevis, bilaterally.  She has been wearing neutral wrist splints nightly for a year, until November 2021, while recovering from right knee replacement.  She noted wrist splints halted progression of her symptoms.  Due to her symptoms she is only able to to 80% of her usual activities.  She presents with MRI cervical and EMG for review.    Patient plans to move to [place] for a retirement community in the near future.  History was obtained from chart review and the patient.    Past Medical History:     Past Medical History:   Diagnosis Date    Anxiety     situational, PCP provided xanax x 3 nights    Arthritis     right knee, hips, lower back     Asthma     well controlled    Breast CA 06/20/2012    Breast cancer 2012    left lumpectomy with  radiation tx    Gastroesophageal reflux disease     mild intermittent    Has immunity to COVID-19 virus 07/30/2020    Pfizer x3    Hx of breast cancer 05/01/2014    Left 1 cm grade 1, ER positive, SLN negative     Hypertension     controlled with med.    Low back pain     Post-operative nausea and vomiting     Primary osteoarthritis of one knee, left 12/21/2016    Primary osteoarthritis of right knee     c/o right knee pain (dull ache), causing restrictive moment & affecting activities. Tried PT & injections with temporary relief. Indication for surgery    Seasonal allergies        Past Surgical History:     Past Surgical History:   Procedure Laterality Date    ABCESS DRAINAGE      from buttock    ARTHROPLASTY, KNEE, TOTAL, COMPUTER NAVIGATION Left 12/21/2016    Procedure: ARTHROPLASTY, KNEE, TOTAL, COMPUTER NAVIGATION;  Surgeon: Cathey Endow, MD;  Location: [facility];  Service: Orthopedics;  Laterality: Left;    ARTHROPLASTY, KNEE, TOTAL, COMPUTER NAVIGATION Right 08/19/2020    Procedure: ARTHROPLASTY, KNEE, TOTAL, COMPUTER NAVIGATION;  Surgeon: Cathey Endow, MD;  Location: [facility];  Service: Orthopedics;  Laterality: Right;    BREAST SURGERY Left 01/2011    well diff CA 0.8 cm Grade 1 (T1) N0MX  ER +    D&C WITH HYSTEROSCOPY      EGD, COLONOSCOPY N/A 01/19/2018    Procedure: EGD, COLONOSCOPY;  Surgeon: Caleb Popp, MD;  Location: [facility];  Service: Gastroenterology;  Laterality: N/A;  EGD, COLONOSCOPY W/IVA    EYE SURGERY Left     cataract    HYSTERECTOMY  1992    JOINT REPLACEMENT         Family History:     Family History   Problem Relation Age of Onset    Cancer Other 64        prostate    Colon cancer Mother 47        Deceased    Cancer Father        Social History:     Social History     Socioeconomic History    Marital status: Widowed     Spouse name: None    Number of children: None    Years of education: None    Highest education level: None   Occupational  History    None   Tobacco Use    Smoking status: Never Smoker    Smokeless tobacco: Never Used   Vaping Use    Vaping Use: Never used   Substance and Sexual Activity    Alcohol use: No    Drug use: No    Sexual activity: None   Other Topics Concern    None   Social History Narrative    None     Social Determinants of Health     Financial Resource Strain:     Difficulty of Paying Living Expenses: Not on file   Food Insecurity:     Worried About Programme researcher, broadcasting/film/video in the Last Year: Not on file    [employer] of Food in the Last Year: Not on file   Transportation Needs:     Lack of Transportation (Medical): Not on file    Lack of Transportation (Non-Medical): Not on file   Physical Activity:     Days of Exercise per Week: Not on file    Minutes of Exercise per Session: Not on file   Stress:     Feeling of Stress : Not on file   Social Connections:     Frequency of Communication with Friends and Family: Not on file    Frequency of Social Gatherings with Friends and Family: Not on file    Attends Religious Services: Not on file    Active Member of Clubs or Organizations: Not on file    Attends Banker Meetings: Not on file    Marital Status: Not on file   Intimate Partner Violence:     Fear of Current or Ex-Partner: Not on file    Emotionally Abused: Not on file    Physically Abused: Not on file    Sexually Abused: Not on file   Housing Stability:     Unable to Pay for Housing in the Last Year: Not on file    Number of Places Lived in the Last Year: Not on file    Unstable Housing in the Last Year: Not on file       Allergies:     Allergies   Allergen Reactions    Erythromycin Itching and Rash  Morphine Itching     Morphine causes severe itching "bugs crawling on skin"    Pt can tolerate oxycodone    Penicillins Rash     Pt states she has taken Keflex before without any problems    Tetracycline Rash    Codeine Other (See Comments)     unknown    Other     Wound Dressing  Adhesive Other (See Comments)     contrindicated d/t thin skin    Contrast [Iodinated Diagnostic Agents] Itching and Rash     Able to eat shellfish    Levofloxacin Rash    Naprosyn [Naproxen] Rash    Sulfa Antibiotics Itching and Rash       Medications:     Current Outpatient Medications on File Prior to Visit   Medication Sig Dispense Refill    acetaminophen (TYLENOL) 500 MG tablet Take 500 mg by mouth every 6 (six) hours as needed.          aspirin EC 81 MG EC tablet Take 1 tablet (81 mg total) by mouth 2 (two) times daily 60 tablet 0    Breo Ellipta 100-25 MCG/INH Aerosol Pwdr, Breath Activated 1 puff daily      calcium carb-cholecalciferol (Caltrate 600+D3) 600-800 MG-UNIT Tab Take 1 tablet by mouth 2 (two) times daily      Calcium Carbonate-Vitamin D (CALTRATE 600+D PO) Take 600 mg by mouth 2 (two) times daily         ciclopirox (PENLAC) 8 % solution Apply topically nightly Apply over nail and surrounding skin. Apply daily over previous coat. After seven (7) days, may remove with alcohol and continue cycle.  To 2 toes on right      denosumab (Prolia) 60 MG/ML Solution Prefilled Syringe subcutaneous injection Inject 60 mg into the skin once Every 6 months      escitalopram (LEXAPRO) 5 MG tablet Take 10 mg by mouth every evening         famotidine (PEPCID) 40 MG tablet Take 40 mg by mouth daily AC dinner      Lactobacillus (PROBIOTIC ACIDOPHILUS PO) Take by mouth daily      lansoprazole (PREVACID) 30 MG capsule Take 30 mg by mouth daily      loratadine (CLARITIN) 10 MG tablet Take 10 mg by mouth every evening          losartan (COZAAR) 100 MG tablet Take 100 mg by mouth daily      montelukast (SINGULAIR) 10 MG tablet Take 10 mg by mouth nightly.      ondansetron (Zofran) 4 MG tablet Take 1 tablet (4 mg total) by mouth every 8 (eight) hours as needed for Nausea 40 tablet 0    senna-docusate (PERICOLACE) 8.6-50 MG per tablet Take 2 tablets by mouth 2 (two) times daily as needed for Constipation.  (Patient taking differently: Take 2 tablets by mouth daily   ) 100 tablet 0    traMADol (ULTRAM) 50 MG tablet Take 1-2 tablets every 6 hours PRN pain 60 tablet 0    [DISCONTINUED] ALPRAZolam (XANAX) 1 MG tablet Take 0.5 mg by mouth nightly as needed         ALPRAZolam (XANAX) 0.5 MG tablet Take 0.5 tablets by mouth as needed       No current facility-administered medications on file prior to visit.       Review of Systems:     Constitution: Negative.   HENT: Positive for hearing loss and postnasal drip.  Eyes: Negative.    Cardiovascular: Negative.         Respiratory: Positive for cough and shortness of breath.    Endocrine: Negative.    Hematologic/Lymphatic: Negative.    Skin: Negative.    Musculoskeletal: Positive for arthralgias, arthralgias and joint swelling.   Gastrointestinal: Negative.    Genitourinary: Positive for urgency.   Neurological: Negative.    Psychiatric/Behavioral: The patient is nervous/anxious.    Allergic/Immunologic: Negative.          Vital Signs:     Vitals:    12/24/20 0957   BP: 186/83   Pulse: 79   Resp: 18       Physical Exam:     General: The patient was well developed and well nourished.  No acute distress. Cooperative with the examination  Neck: Trachea midline, no thyromegaly  Pulmonary: normal respiratory effort. No audible wheezing.   Cardiovascular: no diaphoresis   Abdomen: Soft, non-distended  Extremities: No pedal edema, normal in color.  Right knee swelling  Skin: Normal temperature, no rash  Mental Status: The patient is awake, alert and oriented to person, place, and time.    Cranial nerves:   -CN III, IV, VI: extraocular movements intact; no ptosis                                          -CN VIII: Hearing intact to conversational speech   -CN IX, X:  normal phonation   -CN XI: Symmetric full strength of sternocleidomastoid and trapezius muscles   Motor: Muscle tone normal without spasticity or flaccidity.     Bilateral atrophy to the palmar cutaneous branch  muscles, flexor pollicis brevis and abductor pollicis brevis      Motor: Deltoid  Bicep Tricep Grip ADM  APB IO   Right upper ext  5 5 5 5  4  4 4  +   Left upper ext   5 5 5 5 4 4 4       Sensory:   Light touch intact.  Temperature intact   No Hoffmann's sign bilaterally   No Clonus bilaterally  Tandem normal     Reflexes:  Biceps Triceps BR Patellar Ankle   Right 2+ 2+ 2+  0 2+   Left  2+ 2+ 2+  0 2+   Coordination: No tremors  Gait: Station normal, gait stable     GCS 15          Labs:     Lab Results   Component Value Date    WBC 4.78 12/26/2016    HGB 11.6 08/20/2020    HCT 36.4 08/20/2020    MCV 87.0 12/26/2016    PLT 192 12/26/2016     Lab Results   Component Value Date    NA 134 (L) 08/20/2020    K 4.0 08/20/2020    CL 106 08/20/2020    CO2 23 08/20/2020     No results found for: INR, PT  Lab Results   Component Value Date    BUN 13 08/20/2020     Lab Results   Component Value Date    CREAT 0.6 08/20/2020         Imaging:     I personally reviewed the images with the patient.    12/11/2020   MRI CERVICAL SPINE WITHOUT CONTRAST   TECHNIQUE: Routine MRI cervical spine without  contrast.  FINDINGS:   There is evidence of chronic erosion involving the anterior ring of C1 at   the level of the atlantodens joint, with evidence of diffuse thinning of   the anterior ring. There is posterior subluxation of C1 in relation to C2.   There is approximately 8 mm thick pannus located posterior to the dens   effacing the anterior thecal sac.     The vertebral body heights are normal without acute fracture.     There is no evidence of acute inflammatory facet arthropathy. Moderate   C3-C6 disc degeneration evident. Mild C6-C7 disc degeneration, with   approximately 3 mm degenerative anterolisthesis.    The marrow signal intensity of the bones are otherwise normal.         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    Segmental analysis as below:   At C2-C3: Minimal osteophyte with 2 mm broad  annular bulge with thickened   ligamentum flavum completely effacing the thecal sac with cord flattening   without cord edema, with right foraminal stenosis.   At C3-C4: Disc osteophyte with 3 mm broad annular bulge and thickened   ligamentum flavum with near complete effacement of the thecal sac, with   minimal cord flattening without cord edema with bilateral foraminal   encroachment.   At C4-C5: Disc osteophyte with 3 mm broad annular bulge and thickened   ligamentum flavum effacing the thecal sac with minimal cord flattening   without cord edema, with left foraminal stenosis.   At C5-C6: Disc osteophyte with 3 mm broad annular bulge partially effacing   the thecal sac without cord flattening, with bilateral foraminal stenosis.   At C6-C7: Disc osteophyte with 3 mm broad annular bulge effacing the   anterior thecal sac, with minimal cord flattening without cord edema with   bilateral foraminal encroachment.   At C7-T1: Minimal right-sided disc osteophyte.         IMPRESSION:   1.  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         2.  C3-C4 C4-C5 C5-C6 and C6-C7 multilevel moderate central canal   stenosis with cord flattening without cord edema.        3.  C1-C2 articulation degenerative changes with 8mm thick pannus with   posterior subluxation of C1 on C2. Recommend flexion-extension cervical   spine x-ray including CT scan as necessary.   Electronically signed by: Trinna Post (Einar Pheasant M.D.  [Interpreted at: 31FDIC]   [facility]         AL: 12/12/20     EMG  Interpretation: This is an abnormal study.  First, there is severe left and moderate right carpal tunnel syndrome.  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  Fidel Levy, MD  11/25/2020    Impression & Plan   80 y.o. female with bilateral carpal tunnel syndrome and bilateral C5-6 cervical radiculopathy.  On exam patient has bilateral atrophy to palmar cutaneous branch, flexor pollicis brevis and abductor pollicis brevis; weakness to abductor digiti minimi and abductor pollicis brevis.  EMG shows severe left and moderate right carpal tunnel syndrome as well as bilateral C5-6  cervical radiculopathy.  MRI shows multilevel degenerative changes with central canal stenosis at C2-3, C3-4, C5-6, C6-7, the greatest stenosis at C4-5 and C5-6.    We discussed  C4/5, C5/6 ACDF versus C2-6 posterior laminecotmy and decompression.  Patient also has bilateral carpal tunnel syndrome, that would require carpal tunnel release.    Patient to consider her options for surgery. She is moving to [place] in the near future.  No orders of the defined types were placed in this encounter.       Follow up:   For surgical counseling       Marlaine Hind, MD  Cristie Nash Shearer, NP    Cristie Nash Shearer, NP acted as Neurosurgeon for this encounter for Marlaine Hind, MD. I have reviewed and edited this note when appropriate and agree with the documentation.

## 2020-12-24 ENCOUNTER — Other Ambulatory Visit: Payer: Self-pay

## 2020-12-24 ENCOUNTER — Ambulatory Visit (INDEPENDENT_AMBULATORY_CARE_PROVIDER_SITE_OTHER): Payer: Medicare Other | Admitting: Neurological Surgery

## 2020-12-24 ENCOUNTER — Ambulatory Visit: Admission: RE | Admit: 2020-12-24 | Payer: Self-pay | Source: Ambulatory Visit

## 2020-12-24 ENCOUNTER — Encounter (INDEPENDENT_AMBULATORY_CARE_PROVIDER_SITE_OTHER): Payer: Self-pay | Admitting: Neurological Surgery

## 2020-12-24 DIAGNOSIS — M4802 Spinal stenosis, cervical region: Secondary | ICD-10-CM

## 2020-12-24 DIAGNOSIS — M5030 Other cervical disc degeneration, unspecified cervical region: Secondary | ICD-10-CM

## 2020-12-24 DIAGNOSIS — M5412 Radiculopathy, cervical region: Secondary | ICD-10-CM

## 2020-12-24 DIAGNOSIS — G5603 Carpal tunnel syndrome, bilateral upper limbs: Secondary | ICD-10-CM | POA: Insufficient documentation

## 2020-12-24 DIAGNOSIS — M47812 Spondylosis without myelopathy or radiculopathy, cervical region: Secondary | ICD-10-CM

## 2020-12-24 NOTE — Progress Notes (Signed)
Review of Systems   Constitutional: Negative.    HENT: Positive for hearing loss and postnasal drip.    Eyes: Negative.    Respiratory: Positive for cough and shortness of breath.    Cardiovascular: Negative.    Gastrointestinal: Negative.    Endocrine: Negative.    Genitourinary: Positive for urgency.   Musculoskeletal: Positive for arthralgias and joint swelling.   Skin: Negative.    Allergic/Immunologic: Negative.    Neurological: Negative.    Hematological: Negative.    Psychiatric/Behavioral: The patient is nervous/anxious.      Review of Systems   Review of Systems   Constitution: Negative.   HENT: Positive for hearing loss and postnasal drip.    Eyes: Negative.    Cardiovascular: Negative.         Respiratory: Positive for cough and shortness of breath.    Endocrine: Negative.    Hematologic/Lymphatic: Negative.    Skin: Negative.    Musculoskeletal: Positive for arthralgias, arthralgias and joint swelling.   Gastrointestinal: Negative.    Genitourinary: Positive for urgency.   Neurological: Negative.    Psychiatric/Behavioral: The patient is nervous/anxious.    Allergic/Immunologic: Negative.        Taken by: Towanda Malkin, MA

## 2021-01-09 ENCOUNTER — Encounter (INDEPENDENT_AMBULATORY_CARE_PROVIDER_SITE_OTHER): Payer: Self-pay | Admitting: Neurological Surgery

## 2021-03-31 ENCOUNTER — Other Ambulatory Visit: Payer: Self-pay | Admitting: Physician Assistant

## 2021-03-31 DIAGNOSIS — Z1231 Encounter for screening mammogram for malignant neoplasm of breast: Secondary | ICD-10-CM

## 2021-04-16 ENCOUNTER — Other Ambulatory Visit: Payer: Self-pay

## 2021-04-16 ENCOUNTER — Ambulatory Visit
Admission: RE | Admit: 2021-04-16 | Discharge: 2021-04-16 | Disposition: A | Discharge status: Home / Self Care | Payer: Medicare Other | Source: Ambulatory Visit | Attending: Physician Assistant | Admitting: Physician Assistant

## 2021-04-16 DIAGNOSIS — Z1231 Encounter for screening mammogram for malignant neoplasm of breast: Secondary | ICD-10-CM | POA: Insufficient documentation

## 2021-04-16 HISTORY — DX: Malignant neoplasm of unspecified site of unspecified female breast: C50.919

## 2021-04-16 HISTORY — DX: Personal history of irradiation: Z92.3

## 2021-04-22 ENCOUNTER — Other Ambulatory Visit: Payer: Self-pay | Admitting: Physician Assistant

## 2021-04-22 DIAGNOSIS — R928 Other abnormal and inconclusive findings on diagnostic imaging of breast: Secondary | ICD-10-CM

## 2021-04-28 ENCOUNTER — Ambulatory Visit
Admission: RE | Admit: 2021-04-28 | Discharge: 2021-04-28 | Disposition: A | Discharge status: Home / Self Care | Payer: Medicare Other | Source: Ambulatory Visit | Attending: Physician Assistant | Admitting: Physician Assistant

## 2021-04-28 ENCOUNTER — Other Ambulatory Visit: Payer: Self-pay

## 2021-04-28 DIAGNOSIS — R928 Other abnormal and inconclusive findings on diagnostic imaging of breast: Secondary | ICD-10-CM

## 2021-04-29 ENCOUNTER — Other Ambulatory Visit: Payer: Self-pay | Admitting: Physician Assistant

## 2021-04-29 DIAGNOSIS — R928 Other abnormal and inconclusive findings on diagnostic imaging of breast: Secondary | ICD-10-CM

## 2021-04-29 DIAGNOSIS — N6489 Other specified disorders of breast: Secondary | ICD-10-CM

## 2021-05-07 ENCOUNTER — Ambulatory Visit
Admission: RE | Admit: 2021-05-07 | Discharge: 2021-05-07 | Disposition: A | Discharge status: Home / Self Care | Payer: Medicare Other | Source: Ambulatory Visit | Attending: Physician Assistant | Admitting: Physician Assistant

## 2021-05-07 ENCOUNTER — Other Ambulatory Visit: Payer: Self-pay

## 2021-05-07 DIAGNOSIS — R928 Other abnormal and inconclusive findings on diagnostic imaging of breast: Secondary | ICD-10-CM | POA: Diagnosis present

## 2021-05-07 DIAGNOSIS — N6489 Other specified disorders of breast: Secondary | ICD-10-CM

## 2021-05-08 LAB — SURGICAL PATHOLOGY

## 2021-05-12 HISTORY — PX: BREAST BIOPSY: SHX20

## 2021-08-20 ENCOUNTER — Other Ambulatory Visit: Payer: Self-pay | Admitting: Internal Medicine

## 2021-08-20 ENCOUNTER — Other Ambulatory Visit (HOSPITAL_BASED_OUTPATIENT_CLINIC_OR_DEPARTMENT_OTHER): Payer: Self-pay | Admitting: Internal Medicine

## 2021-08-20 DIAGNOSIS — S32010A Wedge compression fracture of first lumbar vertebra, initial encounter for closed fracture: Secondary | ICD-10-CM

## 2021-08-20 DIAGNOSIS — I10 Essential (primary) hypertension: Secondary | ICD-10-CM

## 2021-08-22 ENCOUNTER — Ambulatory Visit
Admission: RE | Admit: 2021-08-22 | Discharge: 2021-08-22 | Disposition: A | Discharge status: Home / Self Care | Payer: Medicare Other | Source: Ambulatory Visit | Attending: Internal Medicine | Admitting: Internal Medicine

## 2021-08-22 DIAGNOSIS — S32010A Wedge compression fracture of first lumbar vertebra, initial encounter for closed fracture: Secondary | ICD-10-CM | POA: Insufficient documentation

## 2021-08-22 DIAGNOSIS — I10 Essential (primary) hypertension: Secondary | ICD-10-CM | POA: Diagnosis present

## 2021-10-22 ENCOUNTER — Other Ambulatory Visit: Payer: Self-pay | Admitting: Obstetrics and Gynecology

## 2021-10-22 DIAGNOSIS — N6489 Other specified disorders of breast: Secondary | ICD-10-CM

## 2021-11-04 ENCOUNTER — Other Ambulatory Visit: Payer: Self-pay | Admitting: Internal Medicine

## 2021-11-04 DIAGNOSIS — R0600 Dyspnea, unspecified: Secondary | ICD-10-CM

## 2021-11-04 DIAGNOSIS — R053 Chronic cough: Secondary | ICD-10-CM

## 2021-11-14 ENCOUNTER — Ambulatory Visit
Admission: RE | Admit: 2021-11-14 | Discharge: 2021-11-14 | Disposition: A | Discharge status: Home / Self Care | Payer: Medicare Other | Source: Ambulatory Visit | Attending: Obstetrics and Gynecology | Admitting: Obstetrics and Gynecology

## 2021-11-14 ENCOUNTER — Other Ambulatory Visit: Payer: Self-pay

## 2021-11-14 DIAGNOSIS — N6489 Other specified disorders of breast: Secondary | ICD-10-CM | POA: Diagnosis present

## 2021-11-20 ENCOUNTER — Other Ambulatory Visit: Payer: Self-pay | Admitting: Obstetrics and Gynecology

## 2021-11-20 DIAGNOSIS — Z9889 Other specified postprocedural states: Secondary | ICD-10-CM

## 2021-11-20 DIAGNOSIS — N6489 Other specified disorders of breast: Secondary | ICD-10-CM

## 2021-11-20 DIAGNOSIS — R922 Inconclusive mammogram: Secondary | ICD-10-CM

## 2021-11-25 ENCOUNTER — Other Ambulatory Visit: Payer: Self-pay

## 2021-11-25 ENCOUNTER — Ambulatory Visit
Admission: RE | Admit: 2021-11-25 | Discharge: 2021-11-25 | Disposition: A | Discharge status: Home / Self Care | Payer: Medicare Other | Source: Ambulatory Visit | Attending: Internal Medicine | Admitting: Internal Medicine

## 2021-11-25 DIAGNOSIS — R053 Chronic cough: Secondary | ICD-10-CM | POA: Insufficient documentation

## 2021-11-25 DIAGNOSIS — R0600 Dyspnea, unspecified: Secondary | ICD-10-CM | POA: Insufficient documentation

## 2021-12-26 ENCOUNTER — Other Ambulatory Visit: Payer: Self-pay | Admitting: Orthopedic Surgery

## 2021-12-26 DIAGNOSIS — M533 Sacrococcygeal disorders, not elsewhere classified: Secondary | ICD-10-CM

## 2021-12-26 DIAGNOSIS — M4802 Spinal stenosis, cervical region: Secondary | ICD-10-CM

## 2022-01-07 ENCOUNTER — Ambulatory Visit
Admission: RE | Admit: 2022-01-07 | Discharge: 2022-01-07 | Disposition: A | Discharge status: Home / Self Care | Payer: Medicare Other | Source: Ambulatory Visit | Attending: Orthopedic Surgery | Admitting: Orthopedic Surgery

## 2022-01-07 DIAGNOSIS — M4802 Spinal stenosis, cervical region: Secondary | ICD-10-CM | POA: Diagnosis present

## 2022-01-07 DIAGNOSIS — M533 Sacrococcygeal disorders, not elsewhere classified: Secondary | ICD-10-CM | POA: Insufficient documentation

## 2022-02-12 ENCOUNTER — Ambulatory Visit
Admission: RE | Admit: 2022-02-12 | Discharge: 2022-02-12 | Disposition: A | Discharge status: Home / Self Care | Payer: Medicare Other | Source: Ambulatory Visit | Attending: Obstetrics and Gynecology | Admitting: Obstetrics and Gynecology

## 2022-02-12 DIAGNOSIS — R922 Inconclusive mammogram: Secondary | ICD-10-CM | POA: Insufficient documentation

## 2022-02-12 DIAGNOSIS — Z9889 Other specified postprocedural states: Secondary | ICD-10-CM | POA: Diagnosis present

## 2022-02-27 ENCOUNTER — Other Ambulatory Visit: Payer: Self-pay | Admitting: Obstetrics and Gynecology

## 2022-03-04 ENCOUNTER — Other Ambulatory Visit: Payer: Self-pay | Admitting: Obstetrics and Gynecology

## 2022-03-04 DIAGNOSIS — N6489 Other specified disorders of breast: Secondary | ICD-10-CM

## 2022-03-16 IMAGING — US US BREAST*R* LIMITED INC AXILLA
1 series · 5 of 5 positions shown · non-contrast
Comparison: Previous exam(s).

CLINICAL DATA: Possible distortion in the upper right breast
centered in the 11:30 o'clock position at recent screening
mammography. Status post left lumpectomy and radiation therapy for
breast cancer 4384.

EXAM:
DIGITAL DIAGNOSTIC UNILATERAL RIGHT MAMMOGRAM WITH TOMOSYNTHESIS AND
CAD; ULTRASOUND RIGHT BREAST LIMITED
TECHNIQUE: Right digital diagnostic mammography and breast tomosynthesis was
performed. The images were evaluated with computer-aided detection.;
Targeted ultrasound examination of the right breast was performed

[Series 1: us breast*right* limited inc axilla · 0.06mm/px · 5 of 5 slices shown]
[im 1/5]
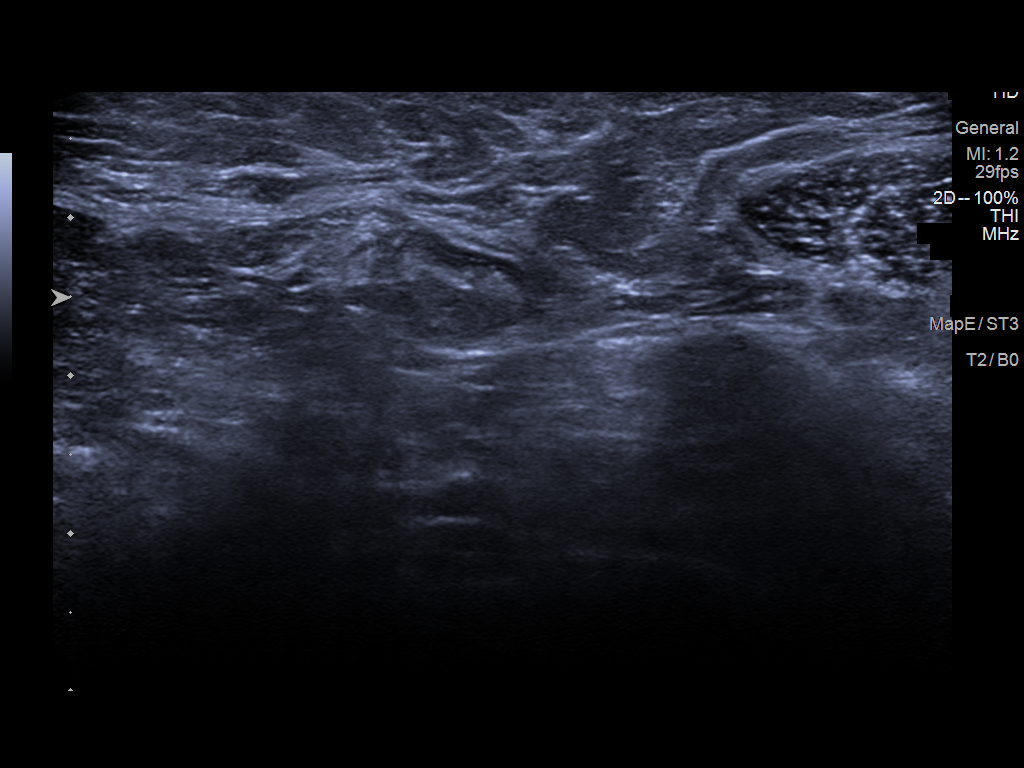
[im 2/5]
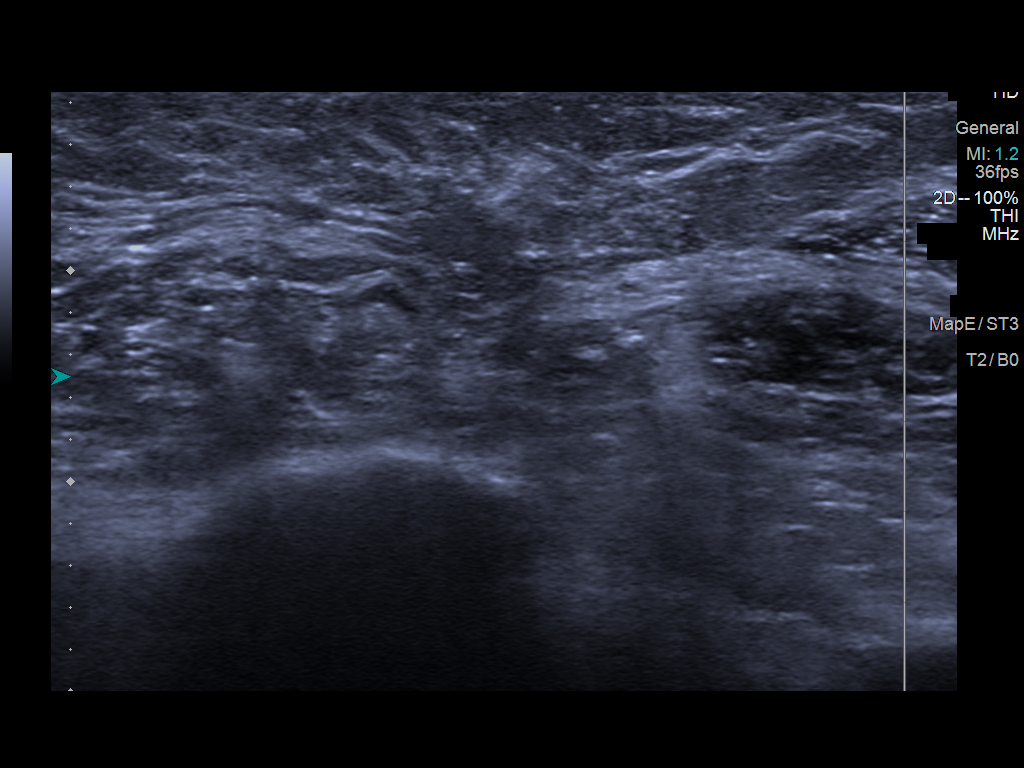
[im 3/5]
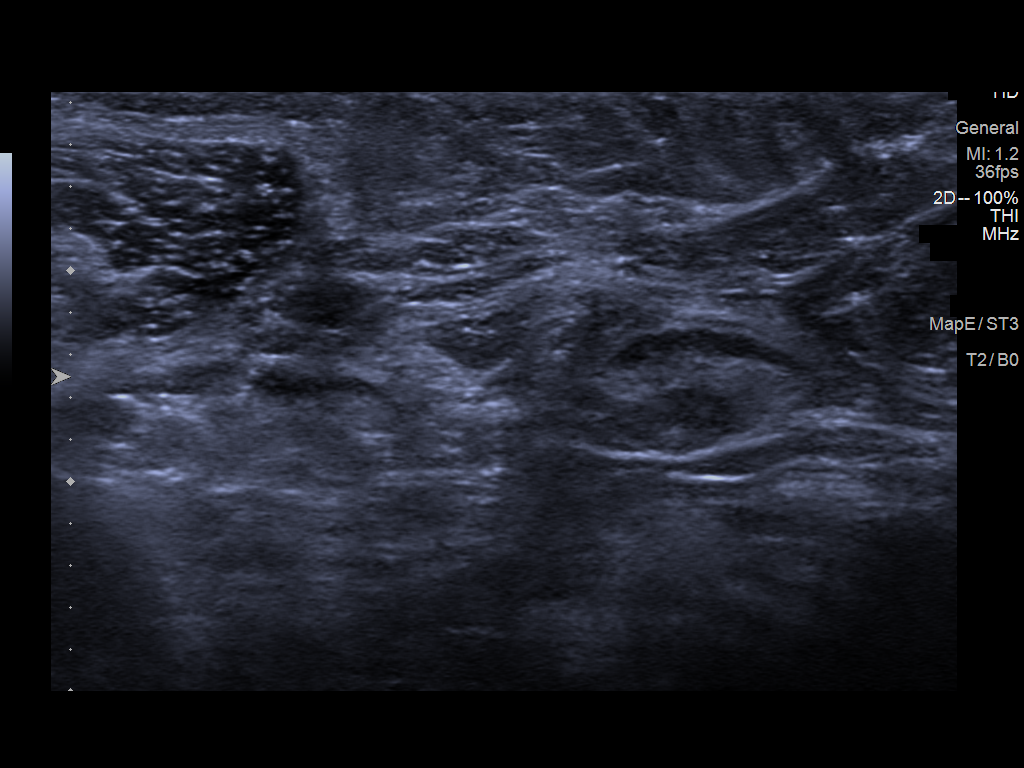
[im 4/5]
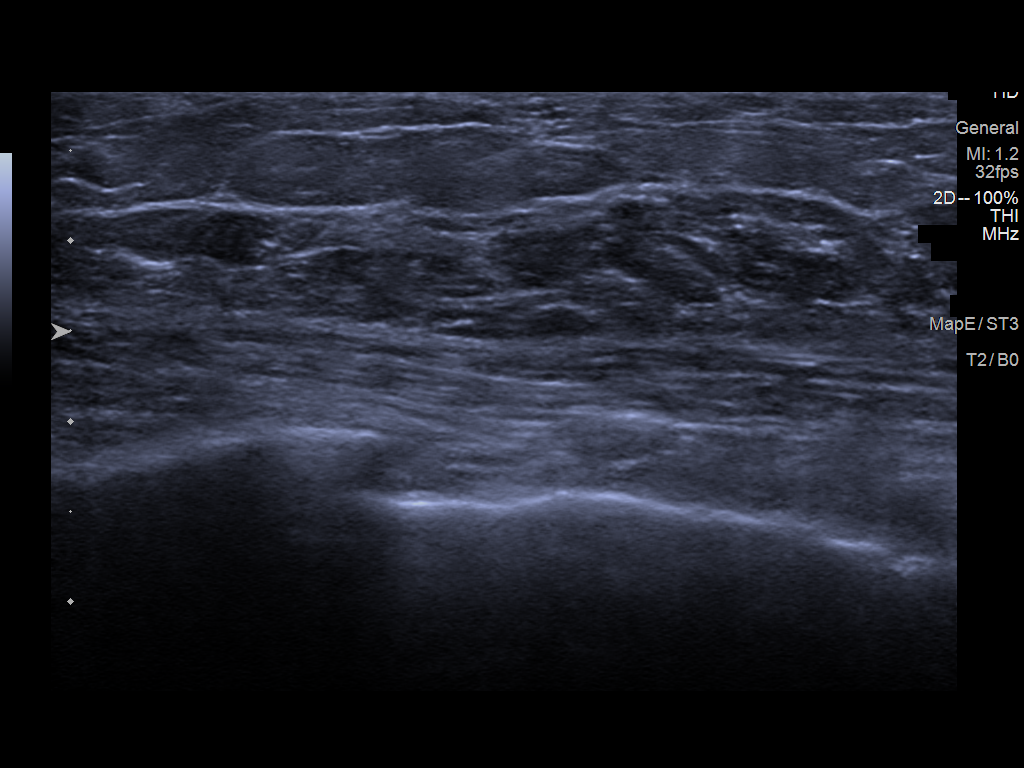
[im 5/5]
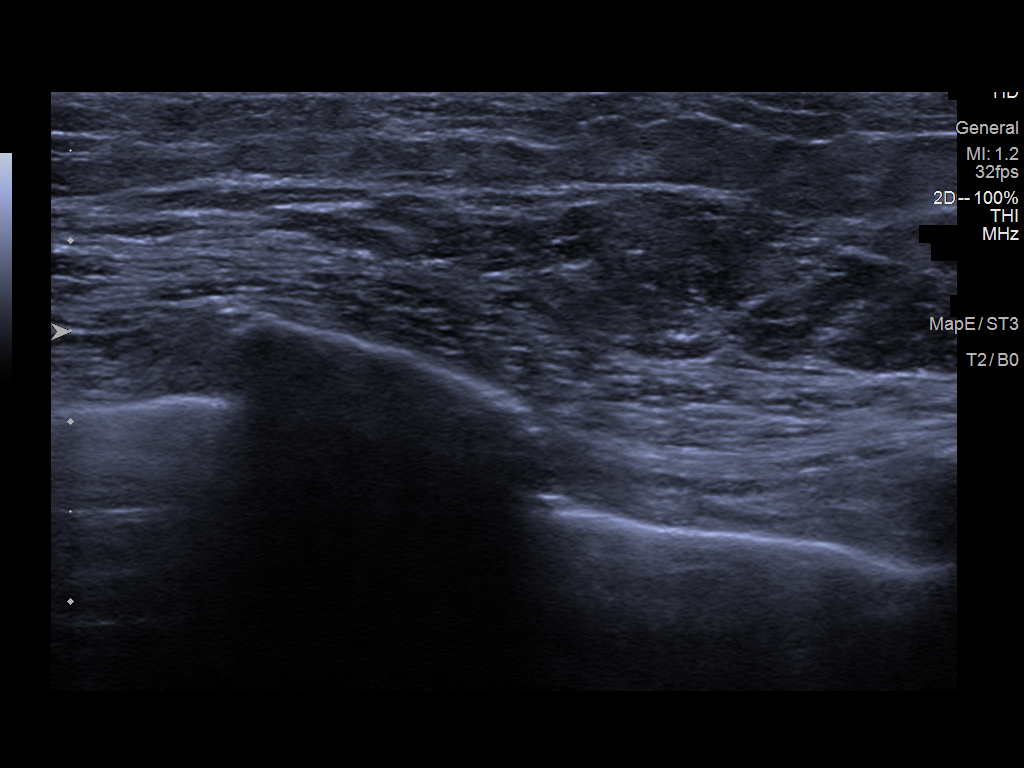

[5 of 5 positions shown; findings below may reference images not displayed]

ACR Breast Density Category c: The breast tissue is heterogeneously
dense, which may obscure small masses.
FINDINGS: 3D tomographic and 2D generated true lateral and spot compression
images of the right breast were obtained. These confirm an area of
subtle asymmetry in the 11:30 o'clock position of breast.

On physical exam, no mass or abnormal thickening is palpable in the
upper right breast and there are no palpable right axillary lymph
nodes.

Targeted ultrasound is performed, showing normal appearing breast
tissue in the upper right breast and normal appearing right axillary
lymph nodes.
IMPRESSION: Subtle asymmetry in the 11:30 o'clock position of the right breast
with no ultrasound correlate. Differential considerations include
malignancy and complex sclerosing lesion.

RECOMMENDATION:
3D stereotactic guided core needle biopsy of the subtle asymmetry in
the 11:30 o'clock position of the right breast. This has been
discussed with the patient and she will be assisted in getting the
biopsy scheduled.

I have discussed the findings and recommendations with the patient.
If applicable, a reminder letter will be sent to the patient
regarding the next appointment.

BI-RADS CATEGORY  4: Suspicious.

## 2022-03-27 ENCOUNTER — Other Ambulatory Visit
Admission: RE | Admit: 2022-03-27 | Discharge: 2022-03-27 | Disposition: A | Discharge status: Home / Self Care | Payer: Medicare Other | Source: Ambulatory Visit | Attending: Pulmonary Disease | Admitting: Pulmonary Disease

## 2022-03-27 DIAGNOSIS — J4540 Moderate persistent asthma, uncomplicated: Secondary | ICD-10-CM | POA: Insufficient documentation

## 2022-03-27 LAB — D-DIMER, QUANTITATIVE: D-Dimer, Quant: 0.98 ug/mL-FEU — ABNORMAL HIGH (ref 0.00–0.50)

## 2022-04-01 ENCOUNTER — Other Ambulatory Visit: Payer: Self-pay | Admitting: Pulmonary Disease

## 2022-04-01 DIAGNOSIS — R7989 Other specified abnormal findings of blood chemistry: Secondary | ICD-10-CM

## 2022-04-02 ENCOUNTER — Ambulatory Visit
Admission: RE | Admit: 2022-04-02 | Discharge: 2022-04-02 | Disposition: A | Discharge status: Home / Self Care | Payer: Medicare Other | Source: Ambulatory Visit | Attending: Pulmonary Disease | Admitting: Pulmonary Disease

## 2022-04-02 DIAGNOSIS — R7989 Other specified abnormal findings of blood chemistry: Secondary | ICD-10-CM | POA: Diagnosis present

## 2022-04-02 LAB — POCT I-STAT CREATININE: Creatinine, Ser: 0.6 mg/dL (ref 0.44–1.00)

## 2022-04-02 MED ORDER — IOHEXOL 350 MG/ML SOLN
75.0000 mL | Freq: Once | INTRAVENOUS | Status: AC | PRN
  Administered 2022-04-02: 75 mL via INTRAVENOUS

## 2022-04-17 ENCOUNTER — Ambulatory Visit
Admission: RE | Admit: 2022-04-17 | Discharge: 2022-04-17 | Disposition: A | Discharge status: Home / Self Care | Payer: Medicare Other | Source: Ambulatory Visit | Attending: Obstetrics and Gynecology | Admitting: Obstetrics and Gynecology

## 2022-04-17 DIAGNOSIS — N6489 Other specified disorders of breast: Secondary | ICD-10-CM | POA: Diagnosis present

## 2022-07-08 ENCOUNTER — Other Ambulatory Visit: Payer: Self-pay | Admitting: Orthopedic Surgery

## 2022-07-08 DIAGNOSIS — M4802 Spinal stenosis, cervical region: Secondary | ICD-10-CM

## 2022-07-16 ENCOUNTER — Ambulatory Visit
Admission: RE | Admit: 2022-07-16 | Discharge: 2022-07-16 | Disposition: A | Discharge status: Home / Self Care | Payer: Medicare Other | Source: Ambulatory Visit | Attending: Orthopedic Surgery | Admitting: Orthopedic Surgery

## 2022-07-16 DIAGNOSIS — M4802 Spinal stenosis, cervical region: Secondary | ICD-10-CM | POA: Insufficient documentation

## 2022-08-17 ENCOUNTER — Encounter: Payer: Self-pay | Admitting: *Deleted

## 2022-08-18 ENCOUNTER — Encounter
Admission: RE | Disposition: A | Discharge status: Home / Self Care | Payer: Self-pay | Source: Home / Self Care | Attending: Gastroenterology

## 2022-08-18 ENCOUNTER — Ambulatory Visit: Payer: Medicare Other | Admitting: Anesthesiology

## 2022-08-18 ENCOUNTER — Ambulatory Visit
Admission: RE | Admit: 2022-08-18 | Discharge: 2022-08-18 | Disposition: A | Discharge status: Home / Self Care | Payer: Medicare Other | Attending: Gastroenterology | Admitting: Gastroenterology

## 2022-08-18 ENCOUNTER — Encounter: Payer: Self-pay | Admitting: *Deleted

## 2022-08-18 DIAGNOSIS — Z853 Personal history of malignant neoplasm of breast: Secondary | ICD-10-CM | POA: Insufficient documentation

## 2022-08-18 DIAGNOSIS — K449 Diaphragmatic hernia without obstruction or gangrene: Secondary | ICD-10-CM | POA: Diagnosis not present

## 2022-08-18 DIAGNOSIS — Z79899 Other long term (current) drug therapy: Secondary | ICD-10-CM | POA: Insufficient documentation

## 2022-08-18 DIAGNOSIS — Z96652 Presence of left artificial knee joint: Secondary | ICD-10-CM | POA: Diagnosis not present

## 2022-08-18 DIAGNOSIS — F419 Anxiety disorder, unspecified: Secondary | ICD-10-CM | POA: Diagnosis not present

## 2022-08-18 DIAGNOSIS — Z800 Family history of malignant neoplasm of digestive organs: Secondary | ICD-10-CM | POA: Diagnosis not present

## 2022-08-18 DIAGNOSIS — Z923 Personal history of irradiation: Secondary | ICD-10-CM | POA: Insufficient documentation

## 2022-08-18 DIAGNOSIS — I10 Essential (primary) hypertension: Secondary | ICD-10-CM | POA: Insufficient documentation

## 2022-08-18 DIAGNOSIS — K219 Gastro-esophageal reflux disease without esophagitis: Secondary | ICD-10-CM | POA: Insufficient documentation

## 2022-08-18 HISTORY — DX: Essential (primary) hypertension: I10

## 2022-08-18 HISTORY — PX: ESOPHAGOGASTRODUODENOSCOPY (EGD) WITH PROPOFOL: SHX5813

## 2022-08-18 HISTORY — DX: Unspecified osteoarthritis, unspecified site: M19.90

## 2022-08-18 HISTORY — DX: Attention-deficit hyperactivity disorder, unspecified type: F90.9

## 2022-08-18 HISTORY — DX: Gastro-esophageal reflux disease without esophagitis: K21.9

## 2022-08-18 HISTORY — DX: Anxiety disorder, unspecified: F41.9

## 2022-08-18 HISTORY — DX: Age-related osteoporosis without current pathological fracture: M81.0

## 2022-08-18 SURGERY — ESOPHAGOGASTRODUODENOSCOPY (EGD) WITH PROPOFOL
Anesthesia: General

## 2022-08-18 MED ORDER — GLYCOPYRROLATE 0.2 MG/ML IJ SOLN
INTRAMUSCULAR | Status: DC | PRN
  Administered 2022-08-18: .2 mg via INTRAVENOUS

## 2022-08-18 MED ORDER — PROPOFOL 10 MG/ML IV BOLUS
INTRAVENOUS | Status: DC | PRN
  Administered 2022-08-18: 60 mg via INTRAVENOUS

## 2022-08-18 MED ORDER — PROPOFOL 500 MG/50ML IV EMUL
INTRAVENOUS | Status: DC | PRN
  Administered 2022-08-18: 150 ug/kg/min via INTRAVENOUS

## 2022-08-18 MED ORDER — SODIUM CHLORIDE 0.9 % IV SOLN: INTRAVENOUS | Status: DC

## 2022-08-18 MED ORDER — LIDOCAINE 2% (20 MG/ML) 5 ML SYRINGE
INTRAMUSCULAR | Status: DC | PRN
  Administered 2022-08-18: 100 mg via INTRAVENOUS

## 2022-08-18 MED ORDER — SODIUM CHLORIDE 0.9 % IV SOLN
INTRAVENOUS | Status: DC
  Administered 2022-08-18: 1000 mL via INTRAVENOUS

## 2022-08-18 MED ORDER — LIDOCAINE HCL (PF) 2 % IJ SOLN
INTRAMUSCULAR | Status: AC
  Filled 2022-08-18: qty 5

## 2022-08-18 NOTE — Op Note (Signed)
[facility] Patient Name: Dorothy Hodges Procedure Date: 08/18/2022 10:33 AM MRN: [id number] Account #: [id number] Date of Birth: 1940/12/21 Admit Type: Outpatient Age: 81 Room: [facility] ENDO ROOM 1 Gender: Female Note Status: Finalized Instrument Name: Altamese Cabal Endoscope [id number] Procedure:             Upper GI endoscopy Indications:           Gastro-esophageal reflux disease Providers:             Andrey Farmer MD, MD Medicines:             Monitored Anesthesia Care Complications:         No immediate complications. Procedure:             Pre-Anesthesia Assessment:                        - Prior to the procedure, a History and Physical was                         performed, and patient medications and allergies were                         reviewed. The patient is competent. The risks and                         benefits of the procedure and the sedation options and                         risks were discussed with the patient. All questions                         were answered and informed consent was obtained.                         Patient identification and proposed procedure were                         verified by the physician, the nurse, the                         anesthesiologist, the anesthetist and the technician                         in the endoscopy suite. Mental Status Examination:                         alert and oriented. Airway Examination: normal                         oropharyngeal airway and neck mobility. Respiratory                         Examination: clear to auscultation. CV Examination:                         normal. Prophylactic Antibiotics: The patient does not                         require prophylactic antibiotics. Prior  Anticoagulants: The patient has taken no anticoagulant                         or antiplatelet agents. ASA Grade Assessment: III - A                         patient with  severe systemic disease. After reviewing                         the risks and benefits, the patient was deemed in                         satisfactory condition to undergo the procedure. The                         anesthesia plan was to use monitored anesthesia care                         (MAC). Immediately prior to administration of                         medications, the patient was re-assessed for adequacy                         to receive sedatives. The heart rate, respiratory                         rate, oxygen saturations, blood pressure, adequacy of                         pulmonary ventilation, and response to care were                         monitored throughout the procedure. The physical                         status of the patient was re-assessed after the                         procedure.                        After obtaining informed consent, the endoscope was                         passed under direct vision. Throughout the procedure,                         the patient's blood pressure, pulse, and oxygen                         saturations were monitored continuously. The Endoscope                         was introduced through the mouth, and advanced to the                         second part of duodenum. The upper GI endoscopy was  accomplished without difficulty. The patient tolerated                         the procedure well. Findings:      A small hiatal hernia was present.      The exam of the esophagus was otherwise normal.      There is no endoscopic evidence of Barrett's esophagus in the lower       third of the esophagus.      The entire examined stomach was normal.      The examined duodenum was normal. Impression:            - Small hiatal hernia.                        - Normal stomach.                        - Normal examined duodenum.                        - No specimens collected. Recommendation:        - Discharge  patient to home.                        - Resume previous diet.                        - Continue present medications.                        - Return to referring physician as previously                         scheduled. Procedure Code(s):     --- Professional ---                        [phone], Esophagogastroduodenoscopy, flexible,                         transoral; diagnostic, including collection of                         specimen(s) by brushing or washing, when performed                         (separate procedure) Diagnosis Code(s):     --- Professional ---                        K44.9, Diaphragmatic hernia without obstruction or                         gangrene                        K21.9, Gastro-esophageal reflux disease without                         esophagitis CPT copyright 2022 American Medical Association. All rights reserved. The codes documented in this report are preliminary and upon coder review may  be revised to meet current compliance requirements. Andrey Farmer MD, MD 08/18/2022 11:16:00 AM Number of  Addenda: 0 Note Initiated On: 08/18/2022 10:33 AM Estimated Blood Loss:  Estimated blood loss: none.      [facility]

## 2022-08-18 NOTE — H&P (Signed)
Outpatient short stay form Pre-procedure 08/18/2022  Dorothy Rubenstein, MD  Primary Physician: Ottie Glazier, MD  Reason for visit:  GERD  History of present illness:    81 y/o lady with history of GERD and possible extraesophageal manifestations here for EGD. No blood thinners.Mother had colon cancer in her 77's.     Current Facility-Administered Medications:    0.9 %  sodium chloride infusion, , Intravenous, Continuous, Zenon Leaf, Hilton Cork, MD   0.9 %  sodium chloride infusion, , Intravenous, Continuous, Donovin Kraemer, Hilton Cork, MD  Medications Prior to Admission  Medication Sig Dispense Refill Last Dose   acetaminophen (TYLENOL) 500 MG tablet Take 500 mg by mouth every 6 (six) hours as needed.    at PRN   albuterol (VENTOLIN HFA) 108 (90 Base) MCG/ACT inhaler Inhale into the lungs every 6 (six) hours as needed for wheezing or shortness of breath.    at PRN   aspirin EC 81 MG tablet Take 81 mg by mouth daily. Swallow whole.   Past Week   azelastine (ASTELIN) 0.1 % nasal spray Place into both nostrils 2 (two) times daily. Use in each nostril as directed    at PRN   Calcium Carbonate-Vit D-Min (CALTRATE 600+D PLUS PO) Take by mouth.   Past Week   cholecalciferol (VITAMIN D3) 10 MCG (400 UNIT) TABS tablet Take 400 Units by mouth.   Past Week   cyanocobalamin 1000 MCG tablet Take 1,000 mcg by mouth daily.   Past Week   docusate sodium (COLACE) 100 MG capsule Take 100 mg by mouth 2 (two) times daily.   Past Week   escitalopram (LEXAPRO) 10 MG tablet Take 10 mg by mouth daily.   08/17/2022   esomeprazole (NEXIUM) 40 MG capsule Take 40 mg by mouth daily at 12 noon.   08/17/2022   losartan (COZAAR) 100 MG tablet Take 100 mg by mouth daily.   08/17/2022   montelukast (SINGULAIR) 10 MG tablet Take 10 mg by mouth at bedtime.   08/17/2022   omega-3 acid ethyl esters (LOVAZA) 1 g capsule Take by mouth 2 (two) times daily.   Past Week   solifenacin (VESICARE) 10 MG tablet Take by mouth daily.   Past  Week   amLODipine (NORVASC) 2.5 MG tablet Take 2.5 mg by mouth daily. (Patient not taking: Reported on 08/18/2022)   Not Taking     Allergies  Allergen Reactions   Codeine Nausea And Vomiting   Erythromycin Itching   Iodinated Contrast Media Hives    Pt given 13 hour premedication   Morphine Nausea And Vomiting   Naprosyn [Naproxen] Itching   Penicillins Itching   Sulfa Antibiotics Itching   Tetracyclines & Related Itching     Past Medical History:  Diagnosis Date   ADHD    Anxiety    Arthritis    Breast cancer ([place])    GERD (gastroesophageal reflux disease)    Hypertension    Osteoporosis    Personal history of radiation therapy     Review of systems:  Otherwise negative.    Physical Exam  Gen: Alert, oriented. Appears stated age.  HEENT: PERRLA. Lungs: No respiratory distress CV: RRR Abd: soft, benign, no masses Ext: No edema    Planned procedures: Proceed with GERD. The patient understands the nature of the planned procedure, indications, risks, alternatives and potential complications including but not limited to bleeding, infection, perforation, damage to internal organs and possible oversedation/side effects from anesthesia. The patient agrees and gives consent to  proceed.  Please refer to procedure notes for findings, recommendations and patient disposition/instructions.     Dorothy Rubenstein, MD [facility]

## 2022-08-18 NOTE — Anesthesia Preprocedure Evaluation (Signed)
Anesthesia Evaluation  Patient identified by MRN, date of birth, ID band Patient awake    Reviewed: Allergy & Precautions, H&P , NPO status , Patient's Chart, lab work & pertinent test results, reviewed documented beta blocker date and time   Airway Mallampati: II   Neck ROM: full    Dental  (+) Poor Dentition   Pulmonary neg pulmonary ROS   Pulmonary exam normal        Cardiovascular Exercise Tolerance: Poor hypertension, On Medications negative cardio ROS Normal cardiovascular exam Rhythm:regular Rate:Normal     Neuro/Psych   Anxiety     negative neurological ROS  negative psych ROS   GI/Hepatic Neg liver ROS,GERD  ,,  Endo/Other  negative endocrine ROS    Renal/GU negative Renal ROS  negative genitourinary   Musculoskeletal   Abdominal   Peds  Hematology negative hematology ROS (+)   Anesthesia Other Findings Past Medical History: No date: ADHD No date: Anxiety No date: Arthritis No date: Breast cancer (HCC) No date: GERD (gastroesophageal reflux disease) No date: Hypertension No date: Osteoporosis No date: Personal history of radiation therapy Past Surgical History: No date: ABDOMINAL HYSTERECTOMY 05/12/2021: BREAST BIOPSY; Right     Comment:  stereo bx-BENIGN BREAST TISSUE WITH APOCRINE METAPLASIA,              STROMAL No date: BREAST LUMPECTOMY No date: EYE SURGERY     Comment:  cataract extraction No date: KNEE ARTHROSCOPY; Bilateral No date: L knee joint replacement   Reproductive/Obstetrics negative OB ROS                             Anesthesia Physical Anesthesia Plan  ASA: 3  Anesthesia Plan: General   Post-op Pain Management:    Induction:   PONV Risk Score and Plan:   Airway Management Planned:   Additional Equipment:   Intra-op Plan:   Post-operative Plan:   Informed Consent: I have reviewed the patients History and Physical, chart, labs and  discussed the procedure including the risks, benefits and alternatives for the proposed anesthesia with the patient or authorized representative who has indicated his/her understanding and acceptance.     Dental Advisory Given  Plan Discussed with: CRNA  Anesthesia Plan Comments:        Anesthesia Quick Evaluation

## 2022-08-18 NOTE — Transfer of Care (Signed)
Immediate Anesthesia Transfer of Care Note  Patient: Dorothy Hodges  Procedure(s) Performed: ESOPHAGOGASTRODUODENOSCOPY (EGD) WITH PROPOFOL  Patient Location: Endoscopy Unit  Anesthesia Type:General  Level of Consciousness: awake  Airway & Oxygen Therapy: Patient Spontanous Breathing and Patient connected to nasal cannula oxygen  Post-op Assessment: Report given to RN and Post -op Vital signs reviewed and stable  Post vital signs: Reviewed and stable  Last Vitals:  Vitals Value Taken Time  BP 118/57 08/18/22 1115  Temp 36 C 08/18/22 1115  Pulse 18 08/18/22 1115  Resp 21 08/18/22 1115  SpO2 91 % 08/18/22 1115    Last Pain:  Vitals:   08/18/22 1115  TempSrc: Temporal  PainSc: Asleep         Complications: No notable events documented.

## 2022-08-18 NOTE — Interval H&P Note (Signed)
History and Physical Interval Note:  08/18/2022 10:59 AM  Dorothy Hodges  has presented today for surgery, with the diagnosis of GERD.  The various methods of treatment have been discussed with the patient and family. After consideration of risks, benefits and other options for treatment, the patient has consented to  Procedure(s): ESOPHAGOGASTRODUODENOSCOPY (EGD) WITH PROPOFOL (N/A) as a surgical intervention.  The patient's history has been reviewed, patient examined, no change in status, stable for surgery.  I have reviewed the patient's chart and labs.  Questions were answered to the patient's satisfaction.     Lesly Rubenstein  Ok to proceed with EGD

## 2022-08-18 NOTE — Anesthesia Postprocedure Evaluation (Signed)
Anesthesia Post Note  Patient: Dorothy Hodges  Procedure(s) Performed: ESOPHAGOGASTRODUODENOSCOPY (EGD) WITH PROPOFOL  Patient location during evaluation: PACU Anesthesia Type: General Level of consciousness: awake and alert Pain management: pain level controlled Vital Signs Assessment: post-procedure vital signs reviewed and stable Respiratory status: spontaneous breathing, nonlabored ventilation, respiratory function stable and patient connected to nasal cannula oxygen Cardiovascular status: blood pressure returned to baseline and stable Postop Assessment: no apparent nausea or vomiting Anesthetic complications: no   No notable events documented.   Last Vitals:  Vitals:   08/18/22 1125 08/18/22 1135  BP: (!) 154/90 (!) 181/61  Pulse: (!) 56 (!) 58  Resp: 17 18  Temp:    SpO2: 96% 96%    Last Pain:  Vitals:   08/18/22 1135  TempSrc:   PainSc: 0-No pain                 Molli Barrows

## 2022-08-19 ENCOUNTER — Encounter: Payer: Self-pay | Admitting: Gastroenterology

## 2022-09-01 ENCOUNTER — Other Ambulatory Visit: Payer: Self-pay | Admitting: Internal Medicine

## 2022-09-01 DIAGNOSIS — R079 Chest pain, unspecified: Secondary | ICD-10-CM

## 2022-09-18 ENCOUNTER — Encounter (HOSPITAL_COMMUNITY): Payer: Self-pay

## 2022-09-18 ENCOUNTER — Telehealth (HOSPITAL_COMMUNITY): Payer: Self-pay | Admitting: *Deleted

## 2022-09-18 NOTE — Telephone Encounter (Signed)
Reaching out to patient to offer assistance regarding upcoming cardiac imaging study; pt verbalizes understanding of appt date/time, parking situation and where to check in, pre-test NPO status and medications ordered, and verified current allergies; name and call back number provided for further questions should they arise  Teigen Parslow RN Navigator Cardiac Imaging [facility] [phone] office [phone] cell  Reviewed how to take 13 hour prep with patient and she verbalized understanding. 

## 2022-09-18 NOTE — Telephone Encounter (Signed)
Attempted to call patient regarding upcoming cardiac CT appointment. Left message on voicemail with name and callback number  Gordy Clement RN Navigator Cardiac [facility] [phone] Office [phone] Cell  Sent a Mychart message to patient with instructions.

## 2022-09-21 ENCOUNTER — Ambulatory Visit
Admission: RE | Admit: 2022-09-21 | Discharge: 2022-09-21 | Disposition: A | Discharge status: Home / Self Care | Payer: Medicare Other | Source: Ambulatory Visit | Attending: Internal Medicine | Admitting: Internal Medicine

## 2022-09-21 DIAGNOSIS — R079 Chest pain, unspecified: Secondary | ICD-10-CM | POA: Insufficient documentation

## 2022-09-21 DIAGNOSIS — K449 Diaphragmatic hernia without obstruction or gangrene: Secondary | ICD-10-CM | POA: Diagnosis not present

## 2022-09-21 DIAGNOSIS — I2510 Atherosclerotic heart disease of native coronary artery without angina pectoris: Secondary | ICD-10-CM | POA: Insufficient documentation

## 2022-09-21 DIAGNOSIS — J90 Pleural effusion, not elsewhere classified: Secondary | ICD-10-CM | POA: Diagnosis not present

## 2022-09-21 MED ORDER — IOHEXOL 350 MG/ML SOLN
100.0000 mL | Freq: Once | INTRAVENOUS | Status: AC | PRN
  Administered 2022-09-21: 100 mL via INTRAVENOUS

## 2022-09-21 MED ORDER — NITROGLYCERIN 0.4 MG SL SUBL
0.8000 mg | SUBLINGUAL_TABLET | Freq: Once | SUBLINGUAL | Status: AC
  Administered 2022-09-21: 0.8 mg via SUBLINGUAL

## 2022-09-21 NOTE — Progress Notes (Signed)
Patient tolerated procedure well. Ambulate w/o difficulty. Denies any lightheadedness or being dizzy. Pt denies any pain at this time. Sitting in chair. Pt is encouraged to drink additional water throughout the day and reason explained to patient. Patient verbalized understanding and all questions answered. ABC intact. No further needs at this time. Discharge from procedure area w/o issues.

## 2022-09-23 ENCOUNTER — Other Ambulatory Visit: Payer: Self-pay | Admitting: Specialist

## 2022-09-23 DIAGNOSIS — R1310 Dysphagia, unspecified: Secondary | ICD-10-CM

## 2022-09-23 DIAGNOSIS — J45909 Unspecified asthma, uncomplicated: Secondary | ICD-10-CM

## 2022-09-23 DIAGNOSIS — R633 Feeding difficulties, unspecified: Secondary | ICD-10-CM

## 2022-10-06 ENCOUNTER — Other Ambulatory Visit: Payer: Self-pay | Admitting: Specialist

## 2022-10-06 DIAGNOSIS — R1310 Dysphagia, unspecified: Secondary | ICD-10-CM

## 2022-10-06 DIAGNOSIS — R633 Feeding difficulties, unspecified: Secondary | ICD-10-CM

## 2022-10-23 ENCOUNTER — Other Ambulatory Visit: Payer: Self-pay | Admitting: Internal Medicine

## 2022-10-23 DIAGNOSIS — Z0000 Encounter for general adult medical examination without abnormal findings: Secondary | ICD-10-CM

## 2022-10-23 DIAGNOSIS — R0600 Dyspnea, unspecified: Secondary | ICD-10-CM

## 2022-11-05 ENCOUNTER — Ambulatory Visit
Admission: RE | Admit: 2022-11-05 | Discharge: 2022-11-05 | Disposition: A | Discharge status: Home / Self Care | Payer: Medicare Other | Source: Ambulatory Visit | Attending: Internal Medicine | Admitting: Internal Medicine

## 2022-11-05 DIAGNOSIS — R0600 Dyspnea, unspecified: Secondary | ICD-10-CM

## 2022-11-05 DIAGNOSIS — Z0000 Encounter for general adult medical examination without abnormal findings: Secondary | ICD-10-CM | POA: Diagnosis present

## 2023-02-05 ENCOUNTER — Ambulatory Visit: Payer: Medicare Other

## 2023-02-11 ENCOUNTER — Ambulatory Visit
Admission: RE | Admit: 2023-02-11 | Discharge: 2023-02-11 | Disposition: A | Discharge status: Home / Self Care | Payer: Medicare Other | Source: Ambulatory Visit | Attending: Specialist | Admitting: Specialist

## 2023-02-11 DIAGNOSIS — R633 Feeding difficulties, unspecified: Secondary | ICD-10-CM

## 2023-02-11 DIAGNOSIS — R1310 Dysphagia, unspecified: Secondary | ICD-10-CM | POA: Insufficient documentation

## 2023-02-11 NOTE — Progress Notes (Signed)
Modified Barium Swallow Study  Patient Details  Name: Dorothy Hodges MRN: [id number] Date of Birth: [birth year]  Today's Date: 02/11/2023  Modified Barium Swallow completed.  Full report located under Chart Review in the Imaging Section.  History of Present Illness Pt is an 82 yo female w/ PMH including: HTN, HLD, GERD, allergies, osteoporosis, anxiety. She moved from [place] about a 2 years ago to be closer to her daughter. Has GI history c/b GERD; Hiatal Hernia. No ignificant symptoms reported at this time. Per GI Note, pt mentioned that she had possible Barrett's Esophagus which on review of EGD performed in 2019 shows irregular z line but no mucosa that extended > 1 cm into esophagus.". She recently fell at her home and fractured her sacrum but is now back to participating in physical therapy. She notes the gurgling in the back of throat and forthy Phlegm which has improved on PPI.  She is followed by Pulmonoolgy, Cardiology, and GI.  Pt had a CT of Chest 10/2022:  "Interval progression of clustered nodularity in the posterior  right middle lobe. Dominant nodular component measuring 14 mm today  has increased from 12 mm previously. Continued close follow-up  recommended and repeat CT chest in 3-6 months suggested to ensure  that this does not progress further. With continued progression,  PET-CT may be warranted to further evaluate.  2. 6 mm left upper lobe nodule appears more confluent than before  and 5 mm left lower lobe pulmonary nodule is new since prior.  Attention on follow-up recommended.  3. Stable chronic atelectasis or scarring in the lingula and  posterior left lower lobe.  4. Stable appearance of densely calcified mediastinal and hilar  lymph nodes.  5. Patulous esophagus again noted.".  Pt denies any change in diet and No weight loss in past 6-12 months. She reports at "dry", hack cough -- intermittent during the day outside of any oral intake.   Clinical Impression Patient  presents with functional oropharyngeal swallowing for age. NO laryngeal penetration nor aspiration noted during this study.   Oral stage is characterized by adequate lip closure, bolus preparation and containment, and anterior to posterior transit. Swallow initiation occurs at the level of the posterior laryngeal surface of the epiglottis moreso w/ liquids; at the valleculae w/ solids/foods.   Pharyngeal stage is noted for slightly reduced tongue base retraction w/ trace residue intermittently (likely age-related), adequate hyolaryngeal excursion, and adequate pharyngeal constriction. Epiglottic deflection is complete; there is NO laryngeal penetration nor aspiration. The trace+ tongue residue fully cleared w/ a dry swallow b/t trials. Pharyngeal stripping wave is complete.  Amplitude/duration of cricopharyngeus opening is WFL. For the majority of trials, there was adequate/complete clearance through the UES/viewable cervical esophagus. HOWEVER, esophageal retention of thin liquid w/ retrograde flow to below the pharyngoesophageal segment(PES), or UES, was noted x1 at end of study. ALSO OF NOTE: at rest, the cervical esophagus appeared to be widely dilated. However, during presentation of po trials(all bolus consistencies), the boluses appeared to DIVERT around the mass-like tiddue protrusion. Would recommend f/u w/ CT scan soft tissue of neck and direct view via upper endoscopy.   Consistencies tested were thin liquids x2 tsps, 1 cup sip, 5 sequential sips, nectar x1 tsp, 1 cup sip, 2 sequential cup sips, honey x1 tsp, pudding x1 tsp, regular solid (1/2 graham cracker with pudding).  Pt intermittently cleared throat during talking; not noted during po trials but following assessment upon education/viewing of the study.   Recommend continue w/  current regular diet(moistened foods) with thin liquids; educated pt verbally on general aspiration(including dry swallow intermittently) and reflux precautions and  encouraged f/u w/ MD. No further ST services indicated. Factors that may increase risk of adverse event in presence of aspiration Dorothy Hodges):  (n/a)   Swallow Evaluation Recommendations Recommendations: PO diet PO Diet Recommendation: Regular;Thin liquids (Level 0) Liquid Administration via: Cup Medication Administration: Whole meds with liquid (vs in a Puree if desired for ease of swallowing/clearing) Supervision: Patient able to self-feed Swallowing strategies  : Minimize environmental distractions;Slow rate;Small bites/sips;Follow solids with liquids Postural changes: Position pt fully upright for meals;Stay upright 30-60 min after meals (GERD precautions) Oral care recommendations: Oral care BID (2x/day);Pt independent with oral care Recommended consults: Consider GI consultation;Consider esophageal assessment -- Direct View; consider CT scan soft tissue of neck        Dorothy Som, MS, CCC-SLP Speech Language Pathologist Rehab Services; [facility] [phone] (ascom) Dorothy Hodges 02/11/2023,4:40 PM

## 2023-02-15 ENCOUNTER — Other Ambulatory Visit: Payer: Self-pay | Admitting: Internal Medicine

## 2023-02-15 DIAGNOSIS — Z1231 Encounter for screening mammogram for malignant neoplasm of breast: Secondary | ICD-10-CM

## 2023-02-18 IMAGING — CT CT ANGIO CHEST
2 of 7 series · 18 of 46 positions shown · IV contrast (APPLIED)
Comparison: November 25, 2021.

CLINICAL DATA: A female at age 81 presents for evaluation of
chronic cough and shortness of breath for several months with
positive D-dimer.

EXAM:
CT ANGIOGRAPHY CHEST WITH CONTRAST
TECHNIQUE: Multidetector CT imaging of the chest was performed using the
standard protocol during bolus administration of intravenous
contrast. Multiplanar CT image reconstructions and MIPs were
obtained to evaluate the vascular anatomy.

[Series 5: thins · axial · 0.52mm/px · z∈[-839,-600]mm · 16 of 269 slices shown]
[im 15/269  lung]
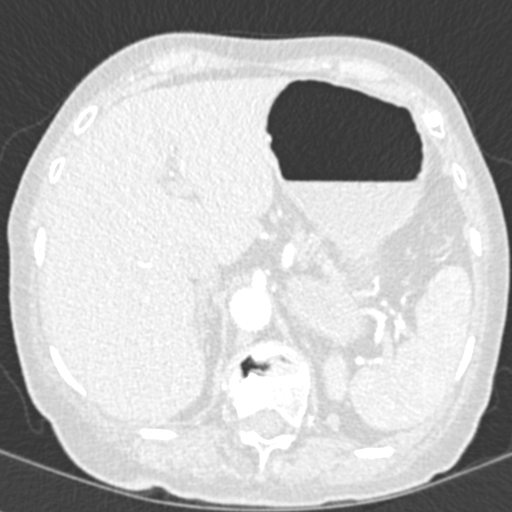
[im 30/269  soft-tissue]
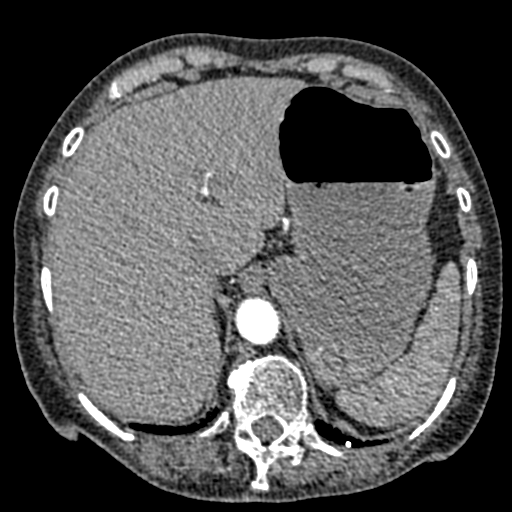
[im 45/269  lung]
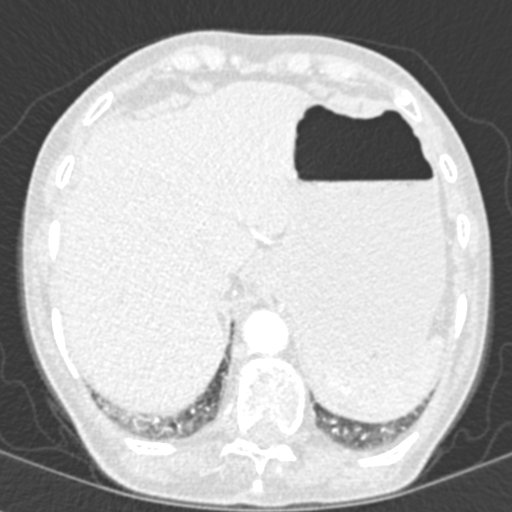
[im 60/269  soft-tissue]
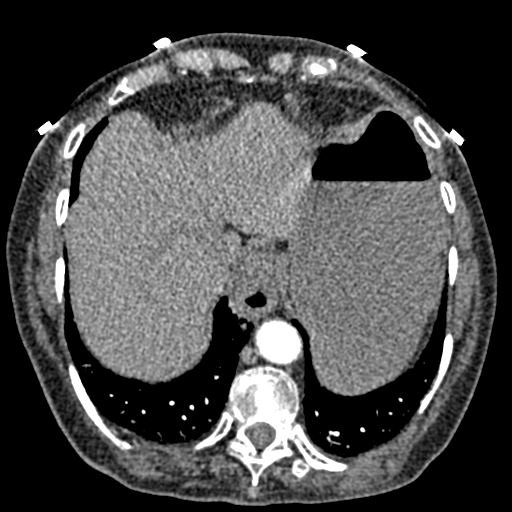
[im 75/269  lung]
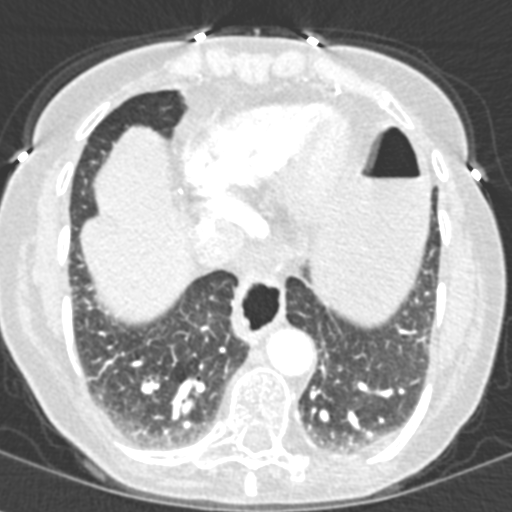
[im 90/269  soft-tissue]
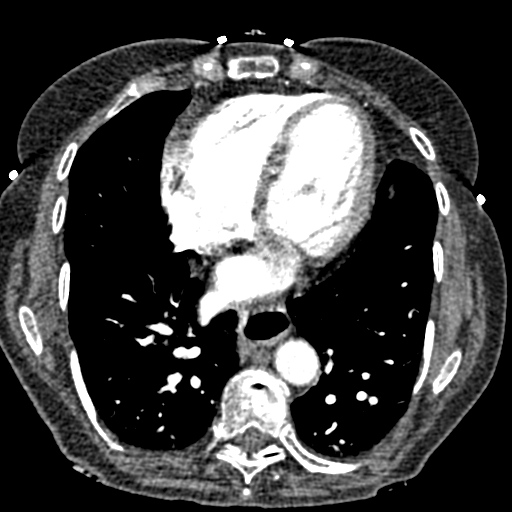
[im 105/269  lung]
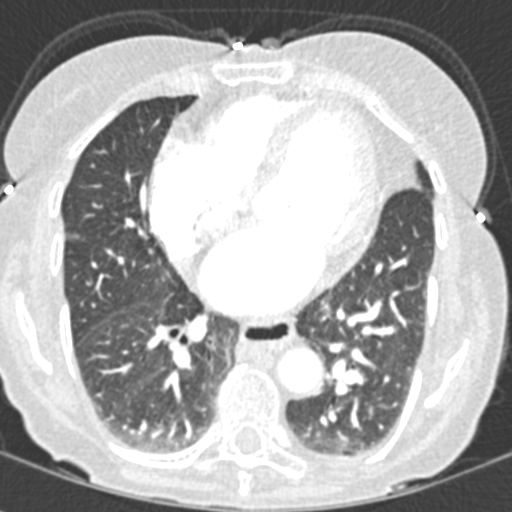
[im 120/269  soft-tissue]
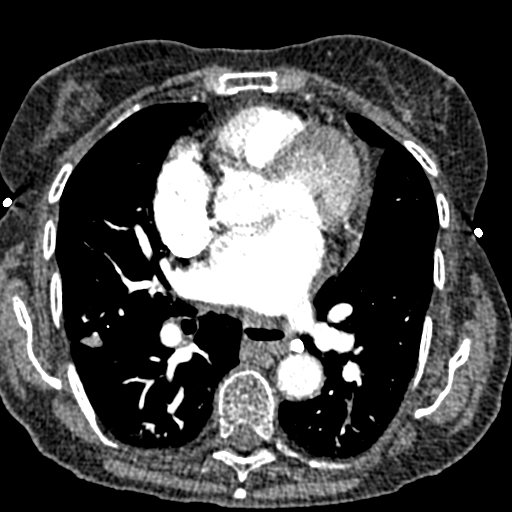
[im 149/269  lung]
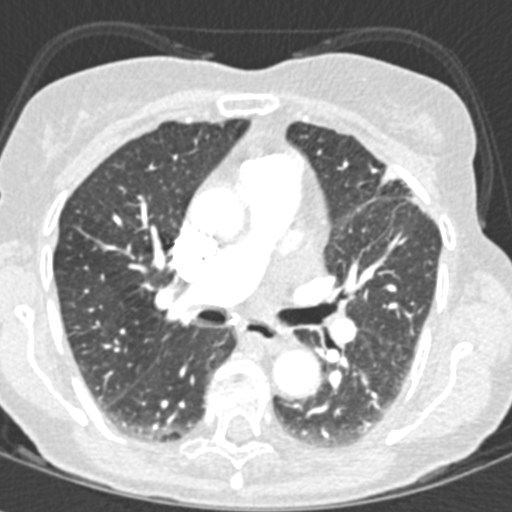
[im 164/269  soft-tissue]
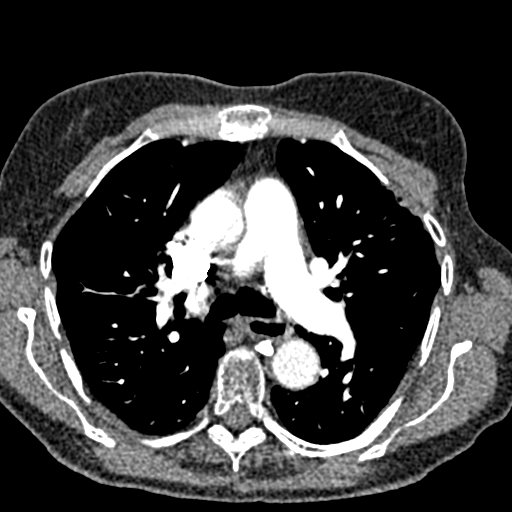
[im 179/269  lung]
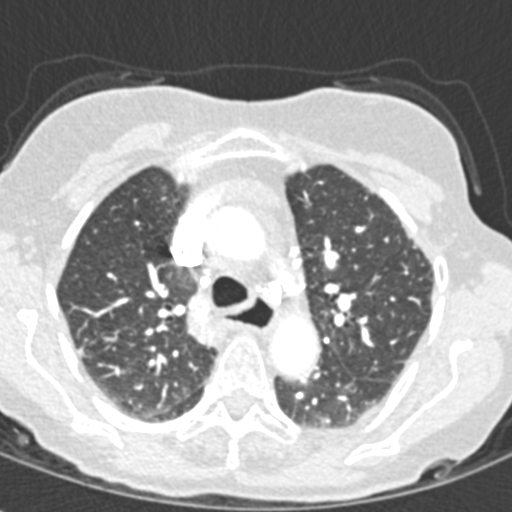
[im 194/269  soft-tissue]
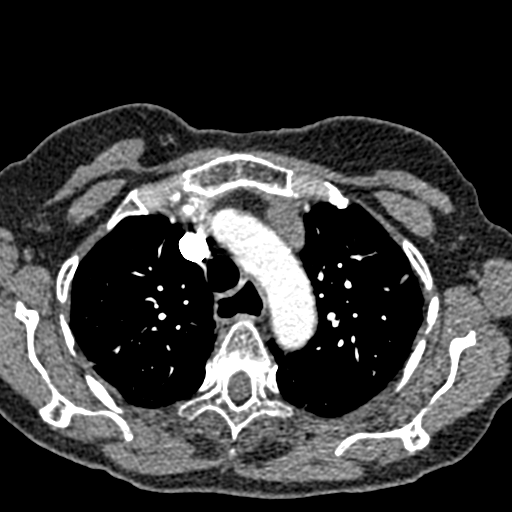
[im 209/269  lung]
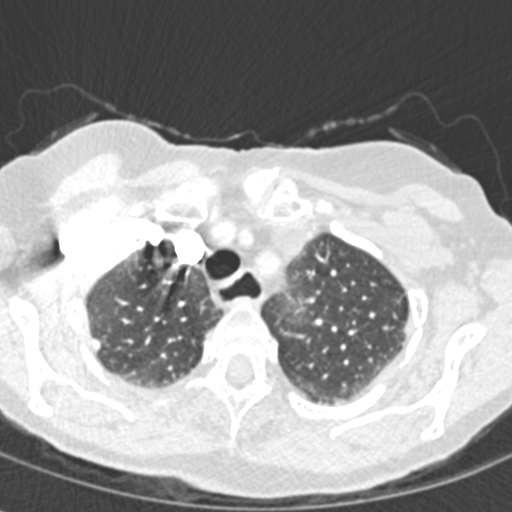
[im 224/269  soft-tissue]
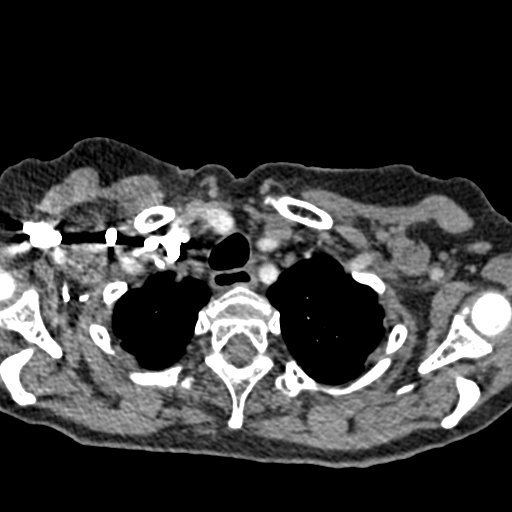
[im 239/269  lung]
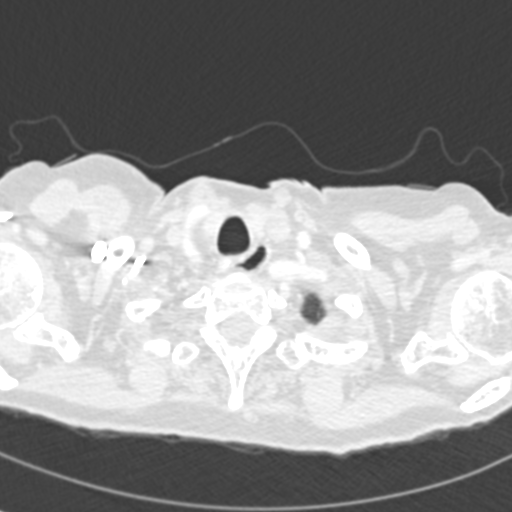
[im 254/269  soft-tissue]
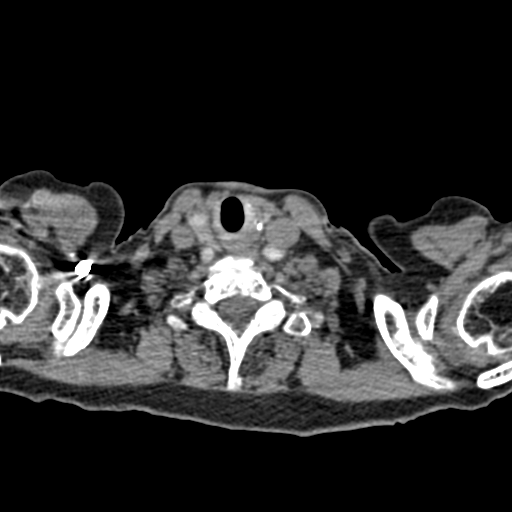

[Series 7: coronal mpr · coronal · 0.50mm/px · 2 of 84 slices shown]
[im 28/84  soft-tissue]
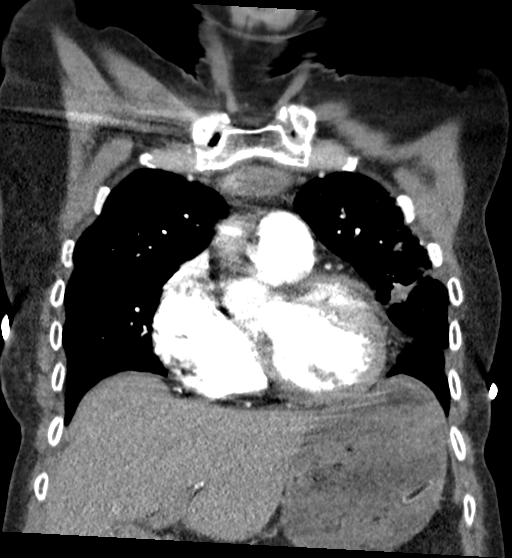
[im 56/84  soft-tissue]
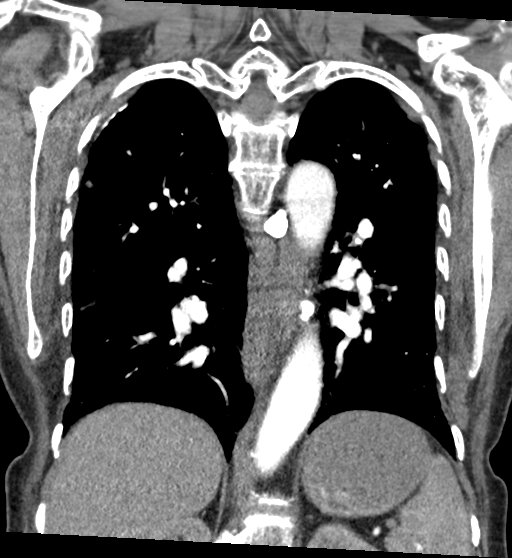

[18 of 46 positions shown; findings below may reference images not displayed]

RADIATION DOSE REDUCTION: This exam was performed according to the
departmental dose-optimization program which includes automated
exposure control, adjustment of the mA and/or kV according to
patient size and/or use of iterative reconstruction technique.

CONTRAST:  75mL OMNIPAQUE IOHEXOL 350 MG/ML SOLN
FINDINGS: Cardiovascular: The aorta is of normal caliber. Heart size top
normal without pericardial effusion. There is scattered aortic
atherosclerosis. No signs of acute aortic process.

Central pulmonary vasculature is opacified 299 Hounsfield units.
This study is negative for pulmonary embolism to the segmental level
not well evaluated beyond this level.

Mediastinum/Nodes: Patulous esophagus is similar to previous
imaging. No signs of thoracic inlet, axillary or of mediastinal
lymphadenopathy.

Lungs/Pleura: Basilar atelectasis. Signs of RIGHT middle lobe
scarring. Scarring in the LEFT chest in the lingula. Airways are
patent. Signs of prior granulomatous disease with calcified lymph
nodes in the posterior mediastinum and calcified granulomata in the
chest.

Upper Abdomen: Incidental imaging of upper abdominal contents shows
no acute process to the extent evaluated.

Musculoskeletal: No acute bone finding. No destructive bone process.
Spinal degenerative changes. Osteopenia

Review of the MIP images confirms the above findings.
IMPRESSION: 1. No signs of pulmonary embolism to the segmental level, limited
assessment beyond this level due to bolus timing. No acute process
in the chest.
2. Scarring in the RIGHT middle lobe and lingula, correlate with
signs of chronic infection such as LUULEY.
3. Patulous esophagus as on previous imaging mild thickening at the
GE junction, a stable finding. This could be seen in the setting of
process such as scleroderma. Could also be related to chronic reflux
and or esophagitis. Follow-up dedicated esophageal evaluation could
be considered.
4. Aortic atherosclerosis.

Aortic Atherosclerosis ([id number]).

## 2023-03-12 ENCOUNTER — Other Ambulatory Visit: Payer: Self-pay | Admitting: Specialist

## 2023-03-12 DIAGNOSIS — J984 Other disorders of lung: Secondary | ICD-10-CM

## 2023-03-12 DIAGNOSIS — R053 Chronic cough: Secondary | ICD-10-CM

## 2023-03-24 ENCOUNTER — Ambulatory Visit
Admission: RE | Admit: 2023-03-24 | Discharge: 2023-03-24 | Disposition: A | Discharge status: Home / Self Care | Payer: Medicare Other | Source: Ambulatory Visit | Attending: Specialist | Admitting: Specialist

## 2023-03-24 ENCOUNTER — Other Ambulatory Visit: Payer: Medicare Other

## 2023-03-24 DIAGNOSIS — R053 Chronic cough: Secondary | ICD-10-CM

## 2023-03-24 DIAGNOSIS — J984 Other disorders of lung: Secondary | ICD-10-CM

## 2023-04-07 ENCOUNTER — Other Ambulatory Visit: Payer: Self-pay | Admitting: Specialist

## 2023-04-07 DIAGNOSIS — R918 Other nonspecific abnormal finding of lung field: Secondary | ICD-10-CM

## 2023-04-19 ENCOUNTER — Ambulatory Visit
Admission: RE | Admit: 2023-04-19 | Discharge: 2023-04-19 | Disposition: A | Discharge status: Home / Self Care | Payer: Medicare Other | Source: Ambulatory Visit | Attending: Internal Medicine | Admitting: Internal Medicine

## 2023-04-19 DIAGNOSIS — Z1231 Encounter for screening mammogram for malignant neoplasm of breast: Secondary | ICD-10-CM | POA: Insufficient documentation

## 2023-04-21 ENCOUNTER — Ambulatory Visit
Admission: RE | Admit: 2023-04-21 | Discharge: 2023-04-21 | Disposition: A | Discharge status: Home / Self Care | Payer: Medicare Other | Source: Ambulatory Visit | Attending: Specialist | Admitting: Specialist

## 2023-04-21 DIAGNOSIS — R918 Other nonspecific abnormal finding of lung field: Secondary | ICD-10-CM | POA: Diagnosis not present

## 2023-04-21 DIAGNOSIS — J984 Other disorders of lung: Secondary | ICD-10-CM | POA: Diagnosis not present

## 2023-04-21 LAB — GLUCOSE, CAPILLARY: Glucose-Capillary: 112 mg/dL — ABNORMAL HIGH (ref 70–99)

## 2023-04-21 MED ORDER — FLUDEOXYGLUCOSE F - 18 (FDG) INJECTION
6.9000 | Freq: Once | INTRAVENOUS | Status: AC | PRN
  Administered 2023-04-21: 7.22 via INTRAVENOUS

## 2023-05-17 ENCOUNTER — Other Ambulatory Visit: Payer: Self-pay

## 2023-05-20 ENCOUNTER — Other Ambulatory Visit: Payer: Medicare Other

## 2023-05-20 NOTE — Progress Notes (Signed)
Recommendations received from tumor board discussion have been communicated to Dr. Meredeth Ide and his clinic for further management.

## 2023-08-25 ENCOUNTER — Emergency Department: Payer: Medicare Other

## 2023-08-25 ENCOUNTER — Emergency Department
Admission: EM | Admit: 2023-08-25 | Discharge: 2023-08-26 | Disposition: A | Discharge status: Home / Self Care | Payer: Medicare Other | Attending: Emergency Medicine | Admitting: Emergency Medicine

## 2023-08-25 ENCOUNTER — Other Ambulatory Visit: Payer: Self-pay

## 2023-08-25 DIAGNOSIS — W1839XA Other fall on same level, initial encounter: Secondary | ICD-10-CM | POA: Diagnosis not present

## 2023-08-25 DIAGNOSIS — S52502A Unspecified fracture of the lower end of left radius, initial encounter for closed fracture: Secondary | ICD-10-CM | POA: Diagnosis not present

## 2023-08-25 DIAGNOSIS — S52602A Unspecified fracture of lower end of left ulna, initial encounter for closed fracture: Secondary | ICD-10-CM | POA: Insufficient documentation

## 2023-08-25 DIAGNOSIS — S6992XA Unspecified injury of left wrist, hand and finger(s), initial encounter: Secondary | ICD-10-CM | POA: Diagnosis present

## 2023-08-25 MED ORDER — KETAMINE HCL 50 MG/5ML IJ SOSY
1.0000 mg/kg | PREFILLED_SYRINGE | Freq: Once | INTRAMUSCULAR | Status: DC
  Filled 2023-08-25: qty 10

## 2023-08-25 MED ORDER — FENTANYL CITRATE PF 50 MCG/ML IJ SOSY
50.0000 ug | PREFILLED_SYRINGE | Freq: Once | INTRAMUSCULAR | Status: AC
  Administered 2023-08-25: 50 ug via INTRAVENOUS
  Filled 2023-08-25: qty 1

## 2023-08-25 MED ORDER — PROPOFOL 10 MG/ML IV BOLUS
0.5000 mg/kg | Freq: Once | INTRAVENOUS | Status: AC
  Administered 2023-08-25: 30.6 mg via INTRAVENOUS
  Filled 2023-08-25: qty 20

## 2023-08-25 NOTE — ED Provider Notes (Addendum)
[facility] Provider Note    Event Date/Time   First MD Initiated Contact with Patient 08/25/23 2158     (approximate)   History   Fall   HPI  Dorothy Hodges is a 82 year old female presenting to the emergency department for evaluation after a fall.  Patient was looking at the moon when she lost her son fell onto her left wrist.  Did not hit her head.  No LOC.  No preceding chest pain, shortness of breath, lightheadedness.    Physical Exam   Triage Vital Signs: ED Triage Vitals [08/25/23 1944]  Encounter Vitals Group     BP 135/61     Systolic BP Percentile      Diastolic BP Percentile      Pulse Rate 62     Resp 18     Temp 97.6 F (36.4 C)     Temp Source Oral     SpO2 98 %     Weight 135 lb (61.2 kg)     Height 5\' 4"  (1.626 m)     Head Circumference      Peak Flow      Pain Score 7     Pain Loc      Pain Education      Exclude from Growth Chart     Most recent vital signs: Vitals:   08/25/23 2334 08/25/23 2343  BP: (!) 171/64 (!) 141/57  Pulse: (!) 46 (!) 51  Resp: 19 (!) 22  Temp:    SpO2: 96% 96%    Nursing notes and vital signs reviewed.  General: Adult female, laying in bed, awake, reactive Head: Atraumatic Chest: Symmetric chest rise, no tenderness to palpation.  Cardiac: Regular rhythm and rate.  Respiratory: Lungs clear to auscultation Abdomen: Soft, nondistended. No tenderness to palpation.  Pelvis: Stable in AP and lateral compression. No tenderness to palpation. MSK: Obvious deformity of the left wrist with overlying ecchymosis.  No open areas of skin.  Intact DP pulse.  Able to move digits.  Sensation intact throughout the hand.  No other deformity or tenderness to remainder of extremities.   Neuro: Alert, oriented. GCS 15. Normal sensation to light touch in bilateral upper and lower extremity. Skin: No evidence of burns or lacerations.   ED Results / Procedures / Treatments   Labs (all labs ordered are  listed, but only abnormal results are displayed) Labs Reviewed - No data to display   EKG EKG independently reviewed interpreted by myself (ER attending) demonstrates:    RADIOLOGY Imaging independently reviewed and interpreted by myself demonstrates:  Initial wrist x-Fantasia Jinkins demonstrating displaced distal radius fracture and angulated ulna fracture. Repeat x-Starling Christofferson redemonstrates fractures with improved alignment, formal radiology read pending  PROCEDURES:  Critical Care performed: No  Reduction of fracture  Date/Time: 08/26/2023 12:04 AM  Performed by: Trinna Post, MD Authorized by: Trinna Post, MD  Consent: Written consent obtained. Risks and benefits: risks, benefits and alternatives were discussed  Sedation: Patient sedated: yes  Comments: Under sedation, deformity of the left radius and ulna was recreated and traction was applied to reduce the patient's left upper extremity fractures into improved alignment.  Remained neurovascularly intact on reevaluation.   .Sedation  Date/Time: 08/26/2023 12:05 AM  Performed by: Trinna Post, MD Authorized by: Trinna Post, MD   Consent:    Consent obtained:  Written   Consent given by:  Patient Universal protocol:    Immediately prior to procedure, a time out was called: yes  Indications:    Procedure performed:  Fracture reduction   Procedure necessitating sedation performed by:  Physician performing sedation Pre-sedation assessment:    Time since last food or drink:  1800   ASA classification: class 2 - patient with mild systemic disease     Mallampati score:  II - soft palate, uvula, fauces visible   Pre-sedation assessments completed and reviewed: airway patency, cardiovascular function, hydration status, mental status, nausea/vomiting, pain level, respiratory function and temperature   Immediate pre-procedure details:    Reassessment: Patient reassessed immediately prior to procedure     Reviewed: vital signs, relevant labs/tests  and NPO status     Verified: bag valve mask available and oxygen available   Procedure details (see MAR for exact dosages):    Preoxygenation:  Nasal cannula   Sedation:  Propofol   Intended level of sedation: moderate (conscious sedation)   Intra-procedure monitoring:  Blood pressure monitoring, cardiac monitor, continuous pulse oximetry, continuous capnometry, frequent LOC assessments and frequent vital sign checks   Intra-procedure events: none     Total Provider sedation time (minutes):  20 Post-procedure details:    Procedure completion:  Tolerated well, no immediate complications Comments:     Patient with improving mental status at time of transition of care.  Marland KitchenSplint Application  Date/Time: 08/26/2023 12:07 AM  Performed by: Trinna Post, MD Authorized by: Trinna Post, MD   Consent:    Consent obtained:  Written   Consent given by:  Patient   Risks, benefits, and alternatives were discussed: yes   Procedure details:    Location:  Wrist   Wrist location:  L wrist   Splint type:  Double sugar tong   Attestation: Splint applied and adjusted personally by me   Post-procedure details:    Distal neurologic exam:  Unchanged   Distal perfusion: unchanged     Procedure completion:  Tolerated    MEDICATIONS ORDERED IN ED: Medications  ketamine 50 mg in normal saline 5 mL (10 mg/mL) syringe (has no administration in time range)  HYDROcodone-acetaminophen (NORCO/VICODIN) 5-325 MG per tablet 1 tablet (has no administration in time range)  fentaNYL (SUBLIMAZE) injection 50 mcg (50 mcg Intravenous Given 08/25/23 2224)  propofol (DIPRIVAN) 10 mg/mL bolus/IV push 30.6 mg (30.6 mg Intravenous Given by Other 08/25/23 2349)     IMPRESSION / MDM / ASSESSMENT AND PLAN / ED COURSE  I reviewed the triage vital signs and the nursing notes.  Differential diagnosis includes, but is not limited to, fracture, dislocation, soft tissue injury, no evidence of head or with rec abdominal  trauma  Patient's presentation is most consistent with acute complicated illness / injury requiring diagnostic workup.  82 year old female presenting to the emergency department for evaluation after a fall with wrist deformity.  X-Terrance Usery does demonstrate fractures of the radius and ulna with displacement.  Patient did have reduction with sedation performed as above.  Did initially have a low blood pressure documented with consideration for ketamine, but blood pressure improved prior to sedation so propofol load was used.  Patient waking up shortly after sedation.  Signed out to oncoming provider pending further metabolization.  When patient has returned to baseline mental status, do think she will be stable for discharge home with follow-up with orthopedics.  Will give her information for on-call orthopedist, but does have established orthopedist that she would likely follow-up with.  Signed out to oncoming physician pending metabolization and disposition.     FINAL CLINICAL IMPRESSION(S) / ED DIAGNOSES  Final diagnoses:  Closed fracture of distal end of left radius, unspecified fracture morphology, initial encounter  Closed fracture of distal end of left ulna, unspecified fracture morphology, initial encounter     Rx / DC Orders   ED Discharge Orders          Ordered    HYDROcodone-acetaminophen (NORCO) 5-325 MG tablet  Every 6 hours PRN        08/26/23 0004             Note:  This document was prepared using Dragon voice recognition software and may include unintentional dictation errors.   Trinna Post, MD 08/26/23 Salley Hews    Trinna Post, MD 08/26/23 [phone]

## 2023-08-25 NOTE — ED Triage Notes (Signed)
Pt reports she was looking at the moon tonight lost her balance and fell on the side walk landing on her left wrist, obvious deformity noted. Pt denies hitting her head when she fell. Pt is not on blood thinners.

## 2023-08-26 DIAGNOSIS — S52602A Unspecified fracture of lower end of left ulna, initial encounter for closed fracture: Secondary | ICD-10-CM | POA: Diagnosis not present

## 2023-08-26 MED ORDER — HYDROCODONE-ACETAMINOPHEN 5-325 MG PO TABS
1.0000 | ORAL_TABLET | Freq: Once | ORAL | Status: AC
  Administered 2023-08-26: 1 via ORAL
  Filled 2023-08-26: qty 1

## 2023-08-26 MED ORDER — HYDROCODONE-ACETAMINOPHEN 5-325 MG PO TABS: 1.0000 | ORAL_TABLET | Freq: Four times a day (QID) | ORAL | 0 refills | Status: AC | PRN

## 2023-08-26 NOTE — Discharge Instructions (Addendum)
You were seen in the ER today for evaluation after a fall.  You did fracture the bones in your wrist, your radius and ulna.  We did a reduction in the ER to get these better into place, but you do need to follow-up with an orthopedic specialist for further evaluation and consideration of surgery.  I included information for on-call orthopedist, but you can also follow-up with your established orthopedic physician.  You can take 650 mg of Tylenol every 6 hours and 600 mg ibuprofen every 6 hours to help with your pain.  If you have breakthrough pain, I have sent a short course of narcotic medicine to your pharmacy.  Please go ahead and start a bowel regimen to prevent constipation with this.  Do not drive or operate machinery when taking this.  Return to the ER for new or worsening symptoms.

## 2023-08-26 NOTE — ED Provider Notes (Signed)
X-ray reviewed, much improved alignment.  Patient awake, discharge.   Pilar Jarvis, MD 08/26/23 [phone]

## 2024-01-24 ENCOUNTER — Other Ambulatory Visit: Payer: Self-pay | Admitting: Gastroenterology

## 2024-01-24 DIAGNOSIS — R053 Chronic cough: Secondary | ICD-10-CM

## 2024-02-04 ENCOUNTER — Ambulatory Visit
Admission: RE | Admit: 2024-02-04 | Discharge: 2024-02-04 | Disposition: A | Discharge status: Home / Self Care | Source: Ambulatory Visit | Attending: Gastroenterology | Admitting: Gastroenterology

## 2024-02-04 DIAGNOSIS — R053 Chronic cough: Secondary | ICD-10-CM | POA: Insufficient documentation

## 2024-06-15 ENCOUNTER — Other Ambulatory Visit: Payer: Self-pay | Admitting: Internal Medicine

## 2024-06-15 DIAGNOSIS — Z1231 Encounter for screening mammogram for malignant neoplasm of breast: Secondary | ICD-10-CM

## 2024-06-22 ENCOUNTER — Ambulatory Visit
Admission: RE | Admit: 2024-06-22 | Discharge: 2024-06-22 | Disposition: A | Discharge status: Home / Self Care | Source: Ambulatory Visit | Attending: Internal Medicine | Admitting: Internal Medicine

## 2024-06-22 DIAGNOSIS — Z1231 Encounter for screening mammogram for malignant neoplasm of breast: Secondary | ICD-10-CM | POA: Insufficient documentation

## 2024-08-04 ENCOUNTER — Other Ambulatory Visit: Payer: Self-pay | Admitting: Physician Assistant

## 2024-08-04 ENCOUNTER — Other Ambulatory Visit: Payer: Self-pay | Admitting: Specialist

## 2024-08-04 DIAGNOSIS — R918 Other nonspecific abnormal finding of lung field: Secondary | ICD-10-CM

## 2024-08-04 DIAGNOSIS — R634 Abnormal weight loss: Secondary | ICD-10-CM

## 2024-08-17 ENCOUNTER — Ambulatory Visit
Admission: RE | Admit: 2024-08-17 | Discharge: 2024-08-17 | Disposition: A | Discharge status: Home / Self Care | Source: Ambulatory Visit | Attending: Specialist | Admitting: Specialist

## 2024-08-17 DIAGNOSIS — R634 Abnormal weight loss: Secondary | ICD-10-CM | POA: Diagnosis present

## 2024-08-17 DIAGNOSIS — R918 Other nonspecific abnormal finding of lung field: Secondary | ICD-10-CM | POA: Insufficient documentation

## 2024-08-22 ENCOUNTER — Ambulatory Visit (INDEPENDENT_AMBULATORY_CARE_PROVIDER_SITE_OTHER): Admitting: Behavioral Health

## 2024-08-22 DIAGNOSIS — F4323 Adjustment disorder with mixed anxiety and depressed mood: Secondary | ICD-10-CM | POA: Diagnosis not present

## 2024-08-22 NOTE — Progress Notes (Addendum)
 [facility] Counselor Initial Adult Exam  Name: Dorothy Hodges Date: 09/06/2024 MRN: [id number] DOB: 1941-07-15 PCP: Auston Reyes BIRCH, MD  Referral from: Rockey Pae, MD @ [facility]  Time spent: 60 min In Person @ [facility] Office Time In: 3:00pm Time Out: 4:00pm  Guardian/Payee:  Medicare Part A & B    Paperwork requested: No   Reason for Visit /Presenting Problem: Elevated anxiety & grief over the loss of normal ambulation/independence. Pt c/o distractibility & the fear anxiety brings  Mental Status Exam: Appearance:   Casual and Neat     Behavior:  Appropriate and Sharing  Motor:  Normal  Speech/Language:   Clear and Coherent  Affect:  Appropriate  Mood:  anxious and sad  Thought process:  normal  Thought content:    WNL  Sensory/Perceptual disturbances:    WNL  Orientation:  oriented to person, place, time/date, and situation  Attention:  Good  Concentration:  Good  Memory:  WNL  Fund of knowledge:   Good  Insight:    Good  Judgment:   Good  Impulse Control:  Good    Risk Assessment: Danger to Self:  No Self-injurious Behavior: No Danger to Others: No Duty to Warn:no Physical Aggression / Violence:No  Access to Firearms a concern: No  Gang Involvement:No  Patient / guardian was educated about steps to take if suicide or homicide risk level increases between visits: yes; appropriate to ICD process While future psychiatric events cannot be accurately predicted, the patient does not currently require acute inpatient psychiatric care and does not currently meet [place]  involuntary commitment criteria.  Substance Abuse History: Current substance abuse: No     Past Psychiatric History:   Previous psychological history is significant for anxiety Outpatient Providers: Reyes Auston, MD History of Psych Hospitalization: No  Psychological Testing: NA   Abuse History:  Victim of: No., NA   Report needed:  No. Victim of Neglect:No. Perpetrator of NA  Witness / Exposure to Domestic Violence: No   Protective Services Involvement: No  Witness to [employer] Violence:  No   Family History:  Family History  Problem Relation Age of Onset   Breast cancer Neg Hx     Living situation: the patient lives alone  Sexual Orientation: Straight  Relationship Status: widowed; Pt was married in 1965 to [facility] [employer] [facility]  Name of spouse / other: Husb died 10 yrs ago; they were a Team! If a parent, number of children / ages: 70 yo Camellia who lives in [place] & has 2 boys & a girl (she is the youngest of all the Grandkids @ 18yo), 56yo Kristen who lives in [place] & has 3 kids (19yo [place], 21yo [place], & 85yo Chiquita), & 60yo son Alm who lives in [place] & has 2 adopted children).  Support Systems: friends; Deanna whom she has known for 53 yrs Children listed above  Financial Stress:  Unk; Pt has 3 Children who would help her in any way they can  Income/Employment/Disability: Neurosurgeon: No   Educational History: Education: some college  Religion/Sprituality/World View: Unk  Any cultural differences that may affect / interfere with treatment:  None noted today  Recreation/Hobbies: reading  Stressors: Health problems   Other: Psychological issues w/health status changes resulting in anx/dep & fear    Strengths: Supportive Relationships, Family, Friends, Journalist, Newspaper, and Able to Communicate Effectively  Barriers:  None noted today   Legal History: Pending legal  issue / charges: The patient has no significant history of legal issues. History of legal issue / charges: NA  Medical History/Surgical History: reviewed Past Medical History:  Diagnosis Date   ADHD    Anxiety    Arthritis    Breast cancer (HCC)    GERD (gastroesophageal reflux disease)    Hypertension    Osteoporosis    Personal history of radiation therapy     Past Surgical History:  Procedure Laterality  Date   ABDOMINAL HYSTERECTOMY     BREAST BIOPSY Right 05/12/2021   stereo bx-BENIGN BREAST TISSUE WITH APOCRINE METAPLASIA, STROMAL   BREAST LUMPECTOMY     ESOPHAGOGASTRODUODENOSCOPY (EGD) WITH PROPOFOL  N/A 08/18/2022   Procedure: ESOPHAGOGASTRODUODENOSCOPY (EGD) WITH PROPOFOL ;  Surgeon: Maryruth Ole DASEN, MD;  Location: [facility] ENDOSCOPY;  Service: Endoscopy;  Laterality: N/A;   EYE SURGERY     cataract extraction   JOINT REPLACEMENT     KNEE ARTHROSCOPY Bilateral    L knee joint replacement      Medications: Current Outpatient Medications  Medication Sig Dispense Refill   acetaminophen  (TYLENOL ) 500 MG tablet Take 500 mg by mouth every 6 (six) hours as needed.     albuterol (VENTOLIN HFA) 108 (90 Base) MCG/ACT inhaler Inhale into the lungs every 6 (six) hours as needed for wheezing or shortness of breath.     amLODipine (NORVASC) 2.5 MG tablet Take 2.5 mg by mouth daily. (Patient not taking: Reported on 08/18/2022)     aspirin EC 81 MG tablet Take 81 mg by mouth daily. Swallow whole.     azelastine (ASTELIN) 0.1 % nasal spray Place into both nostrils 2 (two) times daily. Use in each nostril as directed     Calcium Carbonate-Vit D-Min (CALTRATE 600+D PLUS PO) Take by mouth.     cholecalciferol (VITAMIN D3) 10 MCG (400 UNIT) TABS tablet Take 400 Units by mouth.     cyanocobalamin 1000 MCG tablet Take 1,000 mcg by mouth daily.     docusate sodium (COLACE) 100 MG capsule Take 100 mg by mouth 2 (two) times daily.     escitalopram (LEXAPRO) 10 MG tablet Take 10 mg by mouth daily.     esomeprazole (NEXIUM) 40 MG capsule Take 40 mg by mouth daily at 12 noon.     losartan (COZAAR) 100 MG tablet Take 100 mg by mouth daily.     montelukast (SINGULAIR) 10 MG tablet Take 10 mg by mouth at bedtime.     omega-3 acid ethyl esters (LOVAZA) 1 g capsule Take by mouth 2 (two) times daily.     solifenacin (VESICARE) 10 MG tablet Take by mouth daily.     No current facility-administered medications for  this visit.    Allergies  Allergen Reactions   Codeine Nausea And Vomiting   Erythromycin Itching   Iodinated Contrast Media Hives    Pt given 13 hour premedication   Morphine Nausea And Vomiting   Naprosyn [Naproxen] Itching   Penicillins Itching   Sulfa Antibiotics Itching   Tetracyclines & Related Itching    Diagnoses:  Adjustment disorder with mixed anxiety and depressed mood  Plan of Care: Ayse will attend all sessions as scheduled every 2-3 wk. She will implement the suggestions provided & track these to see what is helpful & what is not. We will gauge her progress ea session to fine-tune the benefit to [place] & promote her best mental health.   Target Date: 09/25/2024  Progress: 5  Frequency: Once every 2-3 wks  Modality: Kennis Richerd LITTIE Hollace, LMFT

## 2024-09-06 ENCOUNTER — Ambulatory Visit (INDEPENDENT_AMBULATORY_CARE_PROVIDER_SITE_OTHER): Admitting: Behavioral Health

## 2024-09-06 DIAGNOSIS — F4323 Adjustment disorder with mixed anxiety and depressed mood: Secondary | ICD-10-CM

## 2024-09-06 NOTE — Progress Notes (Signed)
 [facility] Counselor/Therapist Progress Note  Patient ID: Dorothy Hodges, MRN: [id number],    Date: 09/06/2024  Time Spent: 50 min In Person @ [facility] Office Time In: 2:00pm Time Out: 3:00pm   Treatment Type: Individual Therapy  Reported Symptoms: Elevated anx & some stress due to upcoming move to [facility] portion of current Facility ([facility] in [place]).  Mental Status Exam: Appearance:  Casual     Behavior: Appropriate and Sharing  Motor: Normal  Speech/Language:  Clear and Coherent  Affect: Appropriate  Mood: normal  Thought process: normal  Thought content:   WNL  Sensory/Perceptual disturbances:   WNL  Orientation: oriented to person, place, time/date, and situation  Attention: Good  Concentration: Good  Memory: Some issues w/retrieval of thought & loss of focus  Fund of knowledge:  Good  Insight:   Good  Judgment:  Good  Impulse Control: Good   Risk Assessment: Danger to Self:  No Self-injurious Behavior: No Danger to Others: No Duty to Warn:no Physical Aggression / Violence:No  Access to Firearms a concern: No  Gang Involvement:No   Subjective: [name] is upbeat today & wanting to discuss the Px progress of her hands-both have been injured; the R by carpal tunnel surgery & the R by fall injury.   Pt is moving into the [facility] Living portion of her current residence. It will cause some loss of space, her yard, & overnight visits by Nicanor will no longer happen. These losses dishearten her. Her Sons will be helping her move & she wants to have some say, instead of decisions being made for her. We discussed ways she can be a self-advocate when needed to minimize her sense of lacking independence.  Interventions: Family Systems  Diagnosis:Adjustment disorder with mixed anxiety and depressed mood  Plan: Adeli will cont to speak to herself kindly when she cannot get everything done; give herself grace & understanding like she gives others.  She will speak up about the upcoming move so she has a sense of personal authority over her life & a cont'd sense of self-efficacy. We will meet again in mid-Dec prior to the Christmas Holiday season & we will f/u on her strength of faith, her notion of being a burden & how the move is progressing.  Target Date: 09/30/2024  Progress: 6  Frequency: Once every 2-3 wks  Modality: Kennis Richerd LITTIE Hollace, LMFT

## 2024-10-03 ENCOUNTER — Ambulatory Visit: Admitting: Behavioral Health

## 2024-10-03 DIAGNOSIS — F4323 Adjustment disorder with mixed anxiety and depressed mood: Secondary | ICD-10-CM

## 2024-10-25 ENCOUNTER — Ambulatory Visit: Admitting: Behavioral Health

## 2024-10-25 DIAGNOSIS — F4323 Adjustment disorder with mixed anxiety and depressed mood: Secondary | ICD-10-CM

## 2024-10-25 NOTE — Progress Notes (Signed)
"    [facility] Counselor/Therapist Progress Note  Patient ID: Mairyn Lenahan, MRN: [id number],    Date: 10/03/2024  Time Spent: 45 min In Person @ [facility] Office Time In: 3:00pm Time Out: 3:45pm   Treatment Type: Individual Therapy  Reported Symptoms: Dec in anx/dep & stress as Holidays are approaching  Mental Status Exam: Appearance:  Casual and Neat     Behavior: Appropriate and Sharing  Motor: Normal  Speech/Language:  Clear and Coherent  Affect: Appropriate  Mood: normal  Thought process: normal  Thought content:   WNL  Sensory/Perceptual disturbances:   WNL  Orientation: oriented to person, place, time/date, and situation  Attention: Good  Concentration: Good  Memory: WNL  Fund of knowledge:  Good  Insight:   Good  Judgment:  Good  Impulse Control: Good   Risk Assessment: Danger to Self:  No Self-injurious Behavior: No Danger to Others: No Duty to Warn:no Physical Aggression / Violence:No  Access to Firearms a concern: No  Gang Involvement:No   Subjective: Pt in pleasant mood today w/Dtr Kristin transporting. She was happy about her recent move & the help her Sons provided; Son Camellia stayed a full wk. Son Deatrice & his Wife Sari from [place] were also helpful. This has been a big change for Pt, but she feels her adjustment is on target & understandable.   Pt wants to attend the upcoming wedding in May of Gdtr Vernell. She is concerned not to be a burden.   Interventions: Family Systems and Interpersonal  Diagnosis:Adjustment disorder with mixed anxiety and depressed mood  Plan: Lashanna is pleased w/her recent move to a Facility since she is wheelchair bound & her children cannot move in & care for her. 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 She expects to cont her adjustment to the new space & is positive bc she feels it was her decision. Next visit we will review the  Holidays & her response to the new surroundings.   Target Date: 10/26/2024  Progress: 7  Frequency: Once every 2-3 wks  Modality: Kennis Richerd LITTIE Hollace, LMFT    "

## 2024-10-31 NOTE — Progress Notes (Signed)
"    Dorothy Hodges  Patient ID: Dorothy Hodges, MRN: [id number],    Date: 10/25/2024  Time Spent: 50 min In Person @ [facility] Office Time In: 4:00pm Time Out: 4:55pm   Treatment Type: Individual Therapy; Dtr Dorothy Hodges working in the [employer] & schedules for/with Mom @ end of session  Reported Symptoms: Some reduction in anx/dep & stress since she has been moved to Asst'd Living situation @ [facility] in the past month. She is making this transition on the Campus from [employer] setting.  Mental Status Exam: Appearance:  Casual and Neat     Behavior: Appropriate and Sharing  Motor: Normal  Speech/Language:  Clear and Coherent  Affect: Appropriate  Mood: normal  Thought process: normal  Thought content:   WNL  Sensory/Perceptual disturbances:   WNL  Orientation: oriented to person, place, time/date, and situation  Attention: Good  Concentration: Good  Memory: WNL  Fund of knowledge:  Good  Insight:   Good  Judgment:  Good  Impulse Control: Good   Risk Assessment: Danger to Self:  No Self-injurious Behavior: No Danger to Others: No Duty to Warn:no Physical Aggression / Violence:No  Access to Firearms a concern: No  Gang Involvement:No   Subjective: Pt is in therapy independently now. Dtr Dorothy Hodges transports her & she is in session alone. Dorothy Hodges will schedule @ the end of the session w/Clinician.   Pt has improved adjustment to her new home situation @ [address] but she is mourning her home prior bc of its spaciousness. Currently, she is living in one room w/little space for her Grandchildren to visit or stay overnight. This saddens her as it is a very different setting. She is also mourning her dependency on her Rollator. She cannot walk w/o it & this makes her sad being tied to DME. Her life has changed drastically & she is struggling to be realistic about it & to help herself.   Next visit, Pt will get herself to  psychotherapy w/the transportation provided by [employer]. She & Dtr discussed this w/Clinician & she feels confident she can do this. Dtr cannot transport next visit, so they agreed, the Transporter may need to support Pt coming into our Bldg since she uses DME. We all agreed on this plan & the safety components of it.   Interventions: Grief Therapy, Psycho-education/Bibliotherapy, and Family Systems  Diagnosis:Adjustment disorder with mixed anxiety and depressed mood  Plan: Dorothy Hodges is having some difficulty adjusting to her new residence in [place]. She is very practical & fully understands her situation logically, but she misses her previous home where she was very comfortable, had room for sleepovers w/her Grandchildren & could be a Hostess to other Family & friends in her own place. She feels limited now. Provided support for Dorothy Hodges today as she is struggling to transition & feeling lonely. She will be more active in the [employer] has available to meet her need for social time w/others. This may assist her to speak & related to the needs of others, as well as her own.   Target Date: 11/26/2024  Progress: 6  Frequency: Once every 2-3 wks  Modality: Dorothy Richerd LITTIE Hollace, LMFT    "

## 2024-11-14 ENCOUNTER — Ambulatory Visit: Admitting: Behavioral Health

## 2024-11-14 DIAGNOSIS — F4323 Adjustment disorder with mixed anxiety and depressed mood: Secondary | ICD-10-CM

## 2024-11-14 NOTE — Progress Notes (Signed)
"    [facility] Counselor/Therapist Progress Note  Patient ID: Dorothy Hodges, MRN: [id number],    Date: 11/14/2024  Time Spent: 45 min Caregility video: Pt is home in private & Provider working remotely from [employer]. Pt is aware of the risks/limitations of telehealth & consents to Tx today. Time In: 4:00pm Time Out: 4:45pm   Treatment Type: Individual Therapy  Reported Symptoms: Elevated anx/dep & stress  Mental Status Exam: Appearance:  Casual     Behavior: Appropriate and Sharing  Motor: Normal  Speech/Language:  Clear and Coherent  Affect: Appropriate  Mood: anxious  Thought process: normal  Thought content:   WNL  Sensory/Perceptual disturbances:   WNL  Orientation: oriented to person, place, time/date, and situation  Attention: Good  Concentration: Good  Memory: WNL  Fund of knowledge:  Good  Insight:   Good  Judgment:  Good  Impulse Control: Good   Risk Assessment: Danger to Self:  No Self-injurious Behavior: No Danger to Others: No Duty to Warn:no Physical Aggression / Violence:No  Access to Firearms a concern: No  Gang Involvement:No   Subjective: Pt is keeping busy through the winter weather. She is trying to understand the situation in the [place]. She is very sad after the news of a Dx of Glioblastoma. She is sad for the situation.  Her adjustment is diminished. She is trying to keep her bearings.  Interventions: Family Systems  Diagnosis:Adjustment disorder with mixed anxiety and depressed mood  Plan: Dorothy Hodges is doing her best to make it to the [facility] in May on Mother's Day for her [place] Daughter in [place].   Target Date: 12/10/2024  Progress: 6  Frequency: Once every 2-3 wks  Modality: Dorothy Richerd Dorothy Hodges Hollace, LMFT    "
